# Patient Record
Sex: Female | Born: 1985 | Race: Black or African American | Hispanic: No | Marital: Married | Smoking: Never smoker
Health system: Southern US, Community
[De-identification: ages and names within clinical notes are randomized; demographics above are authoritative.]

## PROBLEM LIST (undated history)

## (undated) ENCOUNTER — Inpatient Hospital Stay (HOSPITAL_COMMUNITY): Payer: Self-pay

## (undated) DIAGNOSIS — B009 Herpesviral infection, unspecified: Secondary | ICD-10-CM

## (undated) DIAGNOSIS — I10 Essential (primary) hypertension: Secondary | ICD-10-CM

## (undated) DIAGNOSIS — B999 Unspecified infectious disease: Secondary | ICD-10-CM

## (undated) DIAGNOSIS — D649 Anemia, unspecified: Secondary | ICD-10-CM

## (undated) DIAGNOSIS — O135 Gestational [pregnancy-induced] hypertension without significant proteinuria, complicating the puerperium: Secondary | ICD-10-CM

## (undated) DIAGNOSIS — K219 Gastro-esophageal reflux disease without esophagitis: Secondary | ICD-10-CM

## (undated) DIAGNOSIS — D696 Thrombocytopenia, unspecified: Secondary | ICD-10-CM

## (undated) DIAGNOSIS — Z9289 Personal history of other medical treatment: Secondary | ICD-10-CM

## (undated) DIAGNOSIS — O99119 Other diseases of the blood and blood-forming organs and certain disorders involving the immune mechanism complicating pregnancy, unspecified trimester: Secondary | ICD-10-CM

## (undated) HISTORY — PX: TONSILLECTOMY: SUR1361

## (undated) HISTORY — PX: CHOLECYSTECTOMY: SHX55

---

## 2015-09-10 LAB — OB RESULTS CONSOLE RUBELLA ANTIBODY, IGM: Rubella: IMMUNE

## 2015-09-10 LAB — OB RESULTS CONSOLE GC/CHLAMYDIA
Chlamydia: NEGATIVE
Gonorrhea: NEGATIVE

## 2015-09-10 LAB — OB RESULTS CONSOLE ABO/RH: RH TYPE: POSITIVE

## 2015-09-10 LAB — OB RESULTS CONSOLE HEPATITIS B SURFACE ANTIGEN: HEP B S AG: NEGATIVE

## 2015-09-10 LAB — OB RESULTS CONSOLE ANTIBODY SCREEN: ANTIBODY SCREEN: NEGATIVE

## 2015-09-10 LAB — OB RESULTS CONSOLE RPR: RPR: NONREACTIVE

## 2015-09-10 LAB — OB RESULTS CONSOLE HIV ANTIBODY (ROUTINE TESTING): HIV: NONREACTIVE

## 2016-03-15 ENCOUNTER — Inpatient Hospital Stay (HOSPITAL_COMMUNITY): Payer: BLUE CROSS/BLUE SHIELD

## 2016-03-15 ENCOUNTER — Inpatient Hospital Stay (HOSPITAL_COMMUNITY)
Admission: AD | Admit: 2016-03-15 | Discharge: 2016-03-15 | Disposition: A | Discharge status: Home / Self Care | Payer: BLUE CROSS/BLUE SHIELD | Source: Ambulatory Visit | Attending: Obstetrics and Gynecology | Admitting: Obstetrics and Gynecology

## 2016-03-15 ENCOUNTER — Encounter (HOSPITAL_COMMUNITY): Payer: Self-pay | Admitting: *Deleted

## 2016-03-15 DIAGNOSIS — Z3A35 35 weeks gestation of pregnancy: Secondary | ICD-10-CM | POA: Insufficient documentation

## 2016-03-15 DIAGNOSIS — O99113 Other diseases of the blood and blood-forming organs and certain disorders involving the immune mechanism complicating pregnancy, third trimester: Secondary | ICD-10-CM | POA: Diagnosis not present

## 2016-03-15 DIAGNOSIS — O368130 Decreased fetal movements, third trimester, not applicable or unspecified: Secondary | ICD-10-CM | POA: Diagnosis present

## 2016-03-15 DIAGNOSIS — O368190 Decreased fetal movements, unspecified trimester, not applicable or unspecified: Secondary | ICD-10-CM | POA: Diagnosis not present

## 2016-03-15 DIAGNOSIS — Z3689 Encounter for other specified antenatal screening: Secondary | ICD-10-CM

## 2016-03-15 DIAGNOSIS — D696 Thrombocytopenia, unspecified: Secondary | ICD-10-CM | POA: Diagnosis not present

## 2016-03-15 DIAGNOSIS — Z3A38 38 weeks gestation of pregnancy: Secondary | ICD-10-CM

## 2016-03-15 HISTORY — DX: Thrombocytopenia, unspecified: O99.119

## 2016-03-15 HISTORY — DX: Gastro-esophageal reflux disease without esophagitis: K21.9

## 2016-03-15 HISTORY — DX: Herpesviral infection, unspecified: B00.9

## 2016-03-15 HISTORY — DX: Thrombocytopenia, unspecified: D69.6

## 2016-03-15 HISTORY — DX: Essential (primary) hypertension: I10

## 2016-03-15 LAB — URINE MICROSCOPIC-ADD ON: RBC / HPF: NONE SEEN RBC/hpf (ref 0–5)

## 2016-03-15 LAB — URINALYSIS, ROUTINE W REFLEX MICROSCOPIC
BILIRUBIN URINE: NEGATIVE
GLUCOSE, UA: NEGATIVE mg/dL
HGB URINE DIPSTICK: NEGATIVE
Ketones, ur: NEGATIVE mg/dL
Nitrite: NEGATIVE
Protein, ur: NEGATIVE mg/dL
SPECIFIC GRAVITY, URINE: 1.01 (ref 1.005–1.030)
pH: 7 (ref 5.0–8.0)

## 2016-03-15 LAB — OB RESULTS CONSOLE GBS: STREP GROUP B AG: POSITIVE

## 2016-03-15 IMAGING — US US MFM FETAL BPP W/O NON-STRESS
1 series · 12 of 27 positions shown · non-contrast
Comparison: none

[Series 1: us mfm fetal bpp w/o non-stress · 27 acquisitions, 12 frames shown]
[im 2/27]
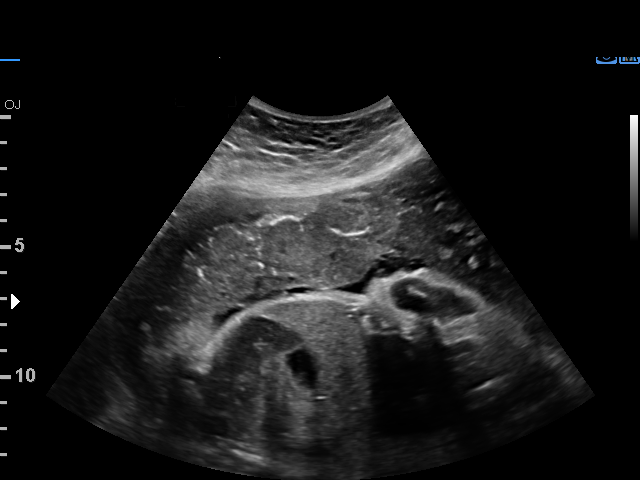
[im 4/27]
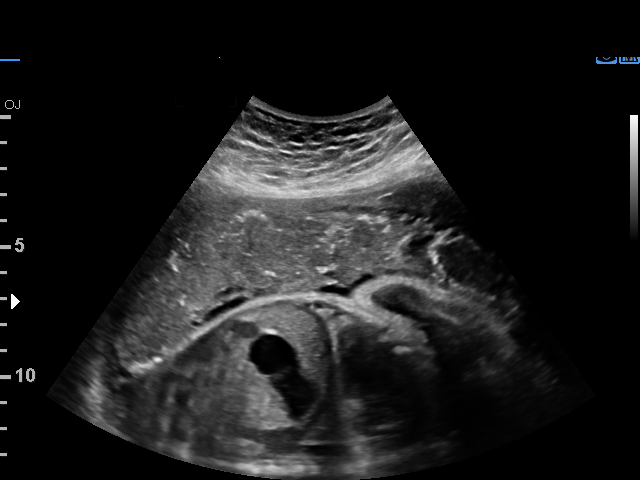
[im 6/27]
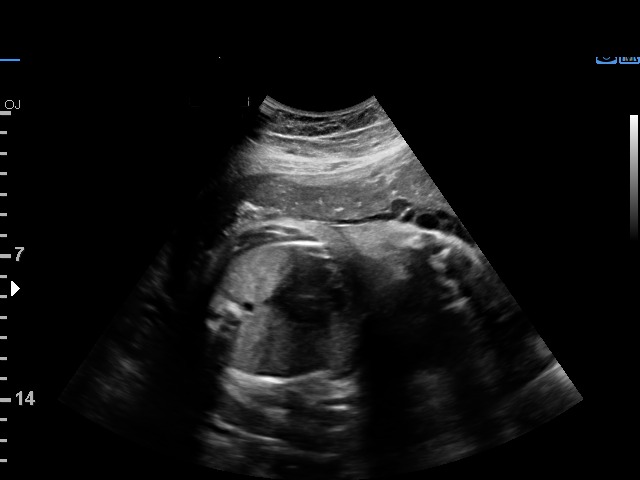
[im 8/27]
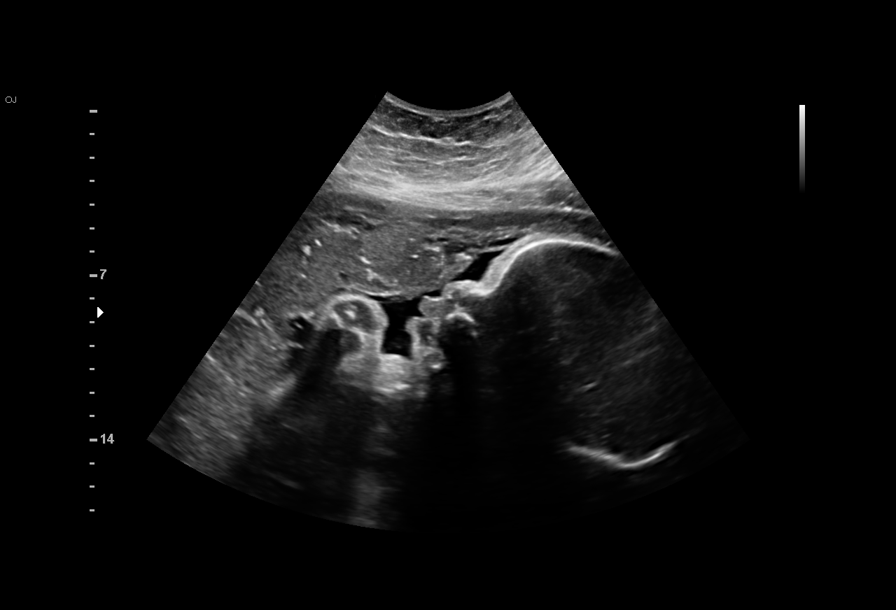
[im 11/27]
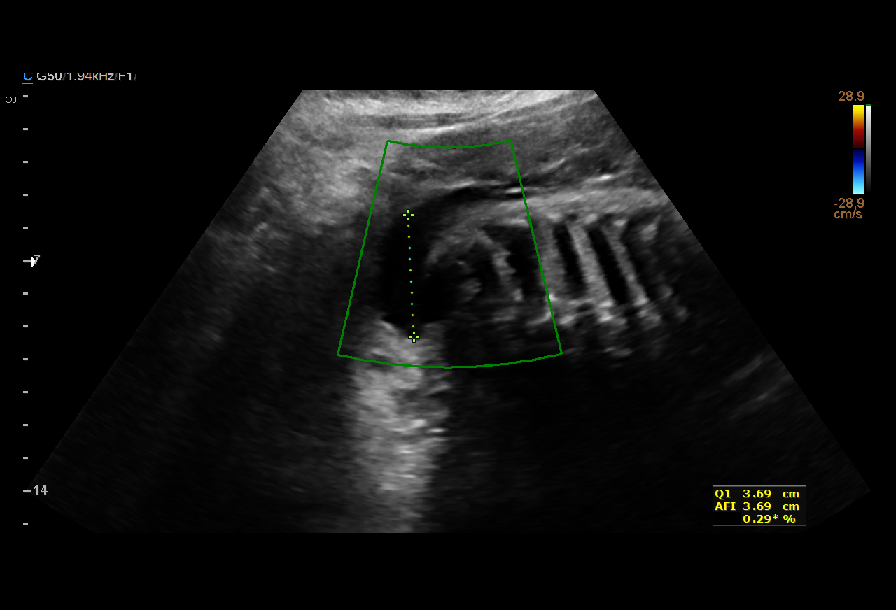
[im 13/27]
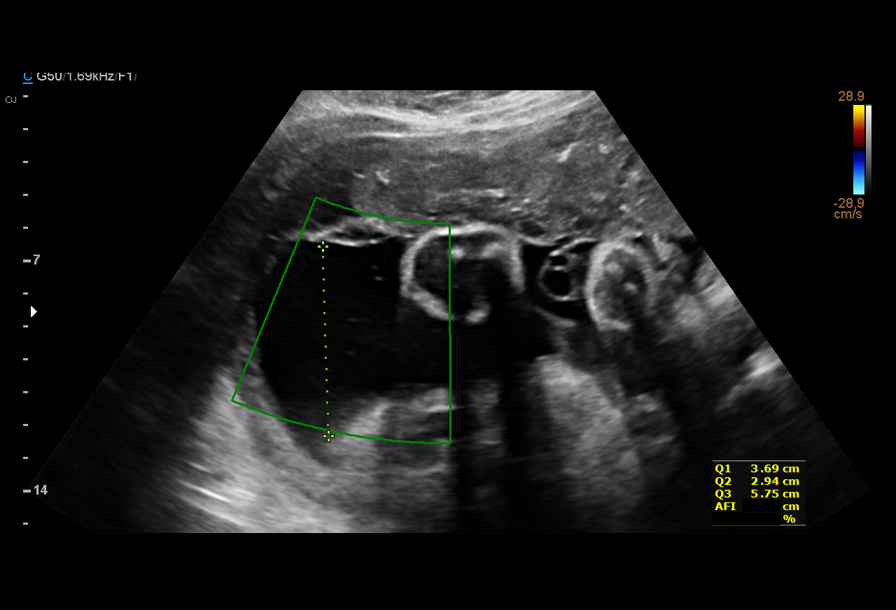
[im 15/27]
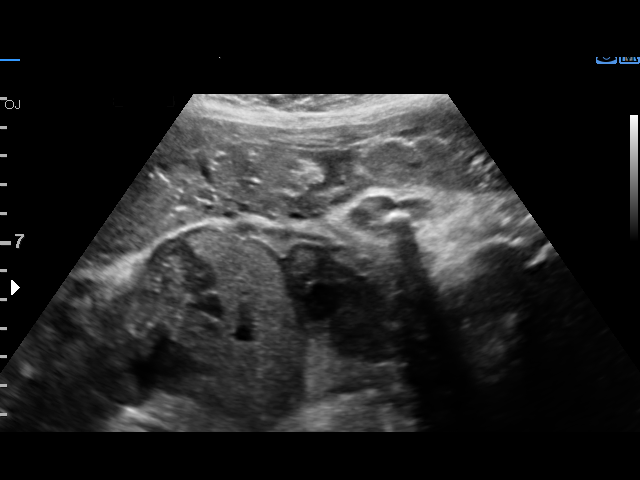
[im 17/27]
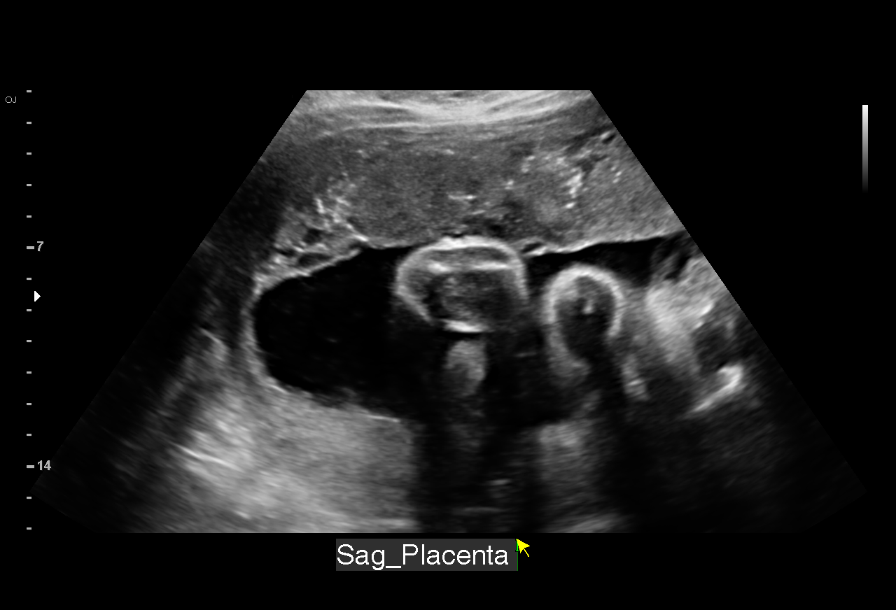
[im 20/27]
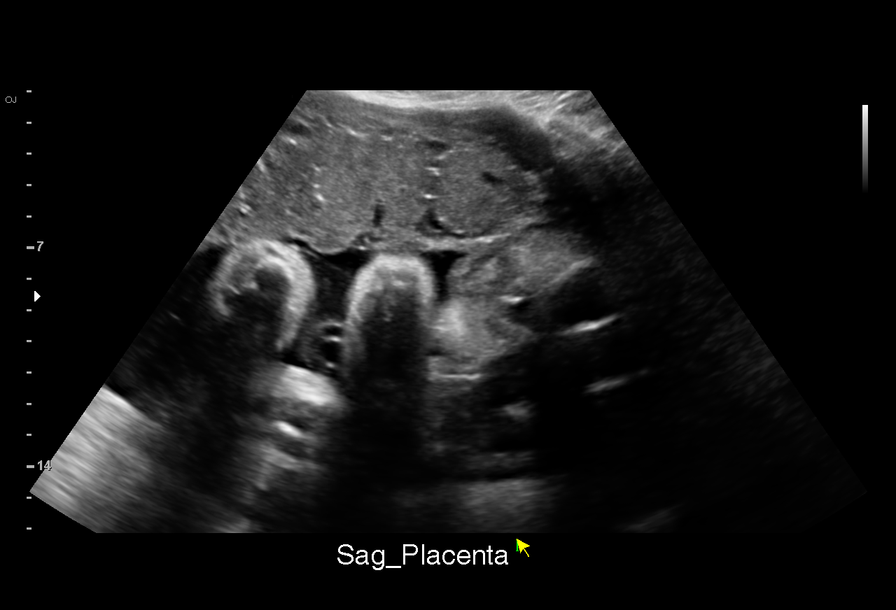
[im 22/27]
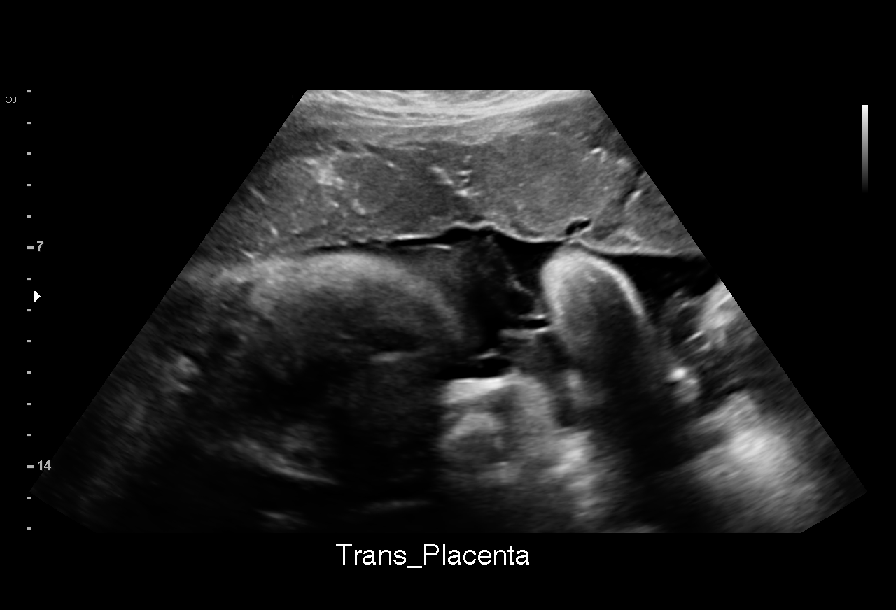
[im 24/27]
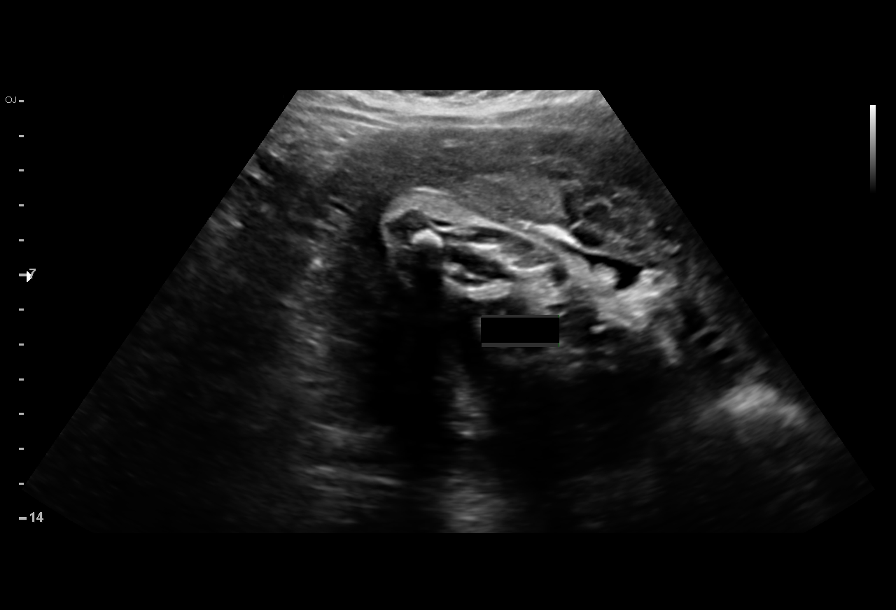
[im 26/27]
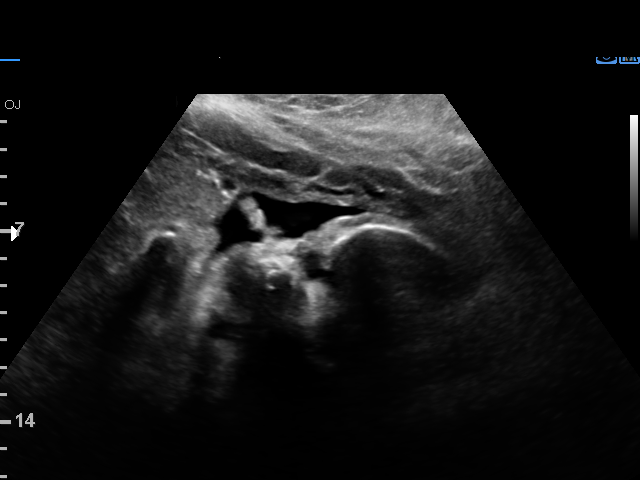

[12 of 27 positions shown; findings below may reference images not displayed]

Indications

35 weeks gestation of pregnancy
Decreased fetal movement                       [MK]
OB History

Gravidity:    1         Term:   0        Prem:   0        SAB:   0
TOP:          0       Ectopic:  0        Living: 0
Fetal Evaluation

Num Of Fetuses:     1
Fetal Heart         144
Rate(bpm):
Cardiac Activity:   Observed
Presentation:       Cephalic
Placenta:           Anterior, above cervical os

Amniotic Fluid
AFI FV:      Subjectively within normal limits

AFI Sum(cm)     %Tile       Largest Pocket(cm)
14.61           53

RUQ(cm)       RLQ(cm)       LUQ(cm)        LLQ(cm)
3.69
Biophysical Evaluation

Amniotic F.V:   Within normal limits       F. Tone:        Observed
F. Movement:    Observed                   Score:          [DATE]
F. Breathing:   Observed
Gestational Age

Clinical EDD:  35w 2d                                        EDD:   [DATE]
Best:          35w 2d     Det. By:  Clinical EDD             EDD:   [DATE]
Impression

SIUP at 35+2
Cephalic presentation
Normal amniotic fluid volume
BPP [DATE]
Recommendations

Follow-up as clinically indicated

## 2016-03-15 NOTE — MAU Provider Note (Signed)
History   [id number]   Chief Complaint  Patient presents with  . Decreased Fetal Movement    HPI Anna Bowman is a 30 y.o. female  G1P0 here with report of decreased fetal movement since last night. Pt seen in office today by Dr. Juliene Pina & sent here for monitoring & BPP. Pt reports no fetal movement since 8 pm last night. Reports feeling 3 contractions today. Denies vaginal bleeding or LOF. Cervix closed in the office. NST reactive in the office today.   No LMP recorded. Patient is pregnant.  OB History  Gravida Para Term Preterm AB Living  1            SAB TAB Ectopic Multiple Live Births               # Outcome Date GA Lbr Len/2nd Weight Sex Delivery Anes PTL Lv  1 Current               Past Medical History:  Diagnosis Date  . GERD (gastroesophageal reflux disease)   . HSV infection   . Hypertension   . Thrombocytopenia affecting pregnancy (HCC)     Family History  Problem Relation Age of Onset  . Hypertension Mother   . Hypertension Father     Social History   Social History  . Marital status: Married    Spouse name: N/A  . Number of children: N/A  . Years of education: N/A   Social History Main Topics  . Smoking status: Never Smoker  . Smokeless tobacco: Never Used  . Alcohol use No  . Drug use: No  . Sexual activity: Not Asked   Other Topics Concern  . None   Social History Narrative  . None    Allergies  Allergen Reactions  . Pineapple     Tongue and lips swell    No current facility-administered medications on file prior to encounter.    No current outpatient prescriptions on file prior to encounter.     Review of Systems  Constitutional: Negative.   Gastrointestinal: Negative.   Genitourinary: Negative.      Physical Exam   Vitals:   03/15/16 1248 03/15/16 1436  BP: 136/75 136/82  Pulse: 67 65  Resp: 16 18  Temp: 98.4 F (36.9 C)   TempSrc: Oral     Physical Exam  Nursing note and vitals reviewed. Constitutional: She  is oriented to person, place, and time. She appears well-developed and well-nourished. No distress.  HENT:  Head: Normocephalic and atraumatic.  Eyes: Conjunctivae are normal. Right eye exhibits no discharge. Left eye exhibits no discharge. No scleral icterus.  Neck: Normal range of motion.  Respiratory: Effort normal. No respiratory distress.  Neurological: She is alert and oriented to person, place, and time.  Skin: Skin is warm and dry. She is not diaphoretic.  Psychiatric: She has a normal mood and affect. Her behavior is normal. Judgment and thought content normal.   Fetal Tracing:  Baseline: 135 Variability: moderate Accelerations: 15x15 Decelerations: none  Toco: x1   MAU Course  Procedures  MDM Reactive fetal tracing Pt reports fetal movement since being in MAU -- 3 times BPP 8/8 with normal AFI S/w Dr. Billy Coast -- ok to discharge home, no further monitoring needed since pt feeling movement, NST reactive, & BPP 8/8  Assessment and Plan  A; 1. Decreased fetal movement   2. [redacted] weeks gestation of pregnancy   3. NST (non-stress test) reactive     P: Discharge home  Fetal kick counts Discussed reasons to return to MAU Keep f/u with OB   Judeth HornErin Tehran Rabenold, NP 03/15/2016 1:29 PM

## 2016-03-15 NOTE — MAU Note (Signed)
Sent from dr's office,  Decreased fm since yesterday, though reactive.  Has been feeling increased pressure and some tightness in abd, cx is closed

## 2016-03-15 NOTE — Discharge Instructions (Signed)
Fetal Movement Counts  Patient Name: __________________________________________________ Patient Due Date: ____________________  Performing a fetal movement count is highly recommended in high-risk pregnancies, but it is good for every pregnant woman to do. Your health care provider may ask you to start counting fetal movements at 28 weeks of the pregnancy. Fetal movements often increase:  · After eating a full meal.  · After physical activity.  · After eating or drinking something sweet or cold.  · At rest.  Pay attention to when you feel the baby is most active. This will help you notice a pattern of your baby's sleep and wake cycles and what factors contribute to an increase in fetal movement. It is important to perform a fetal movement count at the same time each day when your baby is normally most active.   HOW TO COUNT FETAL MOVEMENTS  1. Find a quiet and comfortable area to sit or lie down on your left side. Lying on your left side provides the best blood and oxygen circulation to your baby.  2. Write down the day and time on a sheet of paper or in a journal.  3. Start counting kicks, flutters, swishes, rolls, or jabs in a 2-hour period. You should feel at least 10 movements within 2 hours.  4. If you do not feel 10 movements in 2 hours, wait 2-3 hours and count again. Look for a change in the pattern or not enough counts in 2 hours.  SEEK MEDICAL CARE IF:  · You feel less than 10 counts in 2 hours, tried twice.  · There is no movement in over an hour.  · The pattern is changing or taking longer each day to reach 10 counts in 2 hours.  · You feel the baby is not moving as he or she usually does.  Date: ____________ Movements: ____________ Start time: ____________ Finish time: ____________   Date: ____________ Movements: ____________ Start time: ____________ Finish time: ____________  Date: ____________ Movements: ____________ Start time: ____________ Finish time: ____________  Date: ____________ Movements:  ____________ Start time: ____________ Finish time: ____________  Date: ____________ Movements: ____________ Start time: ____________ Finish time: ____________  Date: ____________ Movements: ____________ Start time: ____________ Finish time: ____________  Date: ____________ Movements: ____________ Start time: ____________ Finish time: ____________  Date: ____________ Movements: ____________ Start time: ____________ Finish time: ____________   Date: ____________ Movements: ____________ Start time: ____________ Finish time: ____________  Date: ____________ Movements: ____________ Start time: ____________ Finish time: ____________  Date: ____________ Movements: ____________ Start time: ____________ Finish time: ____________  Date: ____________ Movements: ____________ Start time: ____________ Finish time: ____________  Date: ____________ Movements: ____________ Start time: ____________ Finish time: ____________  Date: ____________ Movements: ____________ Start time: ____________ Finish time: ____________  Date: ____________ Movements: ____________ Start time: ____________ Finish time: ____________   Date: ____________ Movements: ____________ Start time: ____________ Finish time: ____________  Date: ____________ Movements: ____________ Start time: ____________ Finish time: ____________  Date: ____________ Movements: ____________ Start time: ____________ Finish time: ____________  Date: ____________ Movements: ____________ Start time: ____________ Finish time: ____________  Date: ____________ Movements: ____________ Start time: ____________ Finish time: ____________  Date: ____________ Movements: ____________ Start time: ____________ Finish time: ____________  Date: ____________ Movements: ____________ Start time: ____________ Finish time: ____________   Date: ____________ Movements: ____________ Start time: ____________ Finish time: ____________  Date: ____________ Movements: ____________ Start time: ____________ Finish  time: ____________  Date: ____________ Movements: ____________ Start time: ____________ Finish time: ____________  Date: ____________ Movements: ____________ Start time:   ____________ Finish time: ____________  Date: ____________ Movements: ____________ Start time: ____________ Finish time: ____________  Date: ____________ Movements: ____________ Start time: ____________ Finish time: ____________  Date: ____________ Movements: ____________ Start time: ____________ Finish time: ____________   Date: ____________ Movements: ____________ Start time: ____________ Finish time: ____________  Date: ____________ Movements: ____________ Start time: ____________ Finish time: ____________  Date: ____________ Movements: ____________ Start time: ____________ Finish time: ____________  Date: ____________ Movements: ____________ Start time: ____________ Finish time: ____________  Date: ____________ Movements: ____________ Start time: ____________ Finish time: ____________  Date: ____________ Movements: ____________ Start time: ____________ Finish time: ____________  Date: ____________ Movements: ____________ Start time: ____________ Finish time: ____________   Date: ____________ Movements: ____________ Start time: ____________ Finish time: ____________  Date: ____________ Movements: ____________ Start time: ____________ Finish time: ____________  Date: ____________ Movements: ____________ Start time: ____________ Finish time: ____________  Date: ____________ Movements: ____________ Start time: ____________ Finish time: ____________  Date: ____________ Movements: ____________ Start time: ____________ Finish time: ____________  Date: ____________ Movements: ____________ Start time: ____________ Finish time: ____________  Date: ____________ Movements: ____________ Start time: ____________ Finish time: ____________   Date: ____________ Movements: ____________ Start time: ____________ Finish time: ____________  Date: ____________  Movements: ____________ Start time: ____________ Finish time: ____________  Date: ____________ Movements: ____________ Start time: ____________ Finish time: ____________  Date: ____________ Movements: ____________ Start time: ____________ Finish time: ____________  Date: ____________ Movements: ____________ Start time: ____________ Finish time: ____________  Date: ____________ Movements: ____________ Start time: ____________ Finish time: ____________  Date: ____________ Movements: ____________ Start time: ____________ Finish time: ____________   Date: ____________ Movements: ____________ Start time: ____________ Finish time: ____________  Date: ____________ Movements: ____________ Start time: ____________ Finish time: ____________  Date: ____________ Movements: ____________ Start time: ____________ Finish time: ____________  Date: ____________ Movements: ____________ Start time: ____________ Finish time: ____________  Date: ____________ Movements: ____________ Start time: ____________ Finish time: ____________  Date: ____________ Movements: ____________ Start time: ____________ Finish time: ____________     This information is not intended to replace advice given to you by your health care provider. Make sure you discuss any questions you have with your health care provider.     Document Released: 07/05/2006 Document Revised: 06/26/2014 Document Reviewed: 04/01/2012  Elsevier Interactive Patient Education ©2016 [employer]

## 2016-03-19 DIAGNOSIS — Z9289 Personal history of other medical treatment: Secondary | ICD-10-CM

## 2016-03-19 HISTORY — DX: Personal history of other medical treatment: Z92.89

## 2016-03-29 ENCOUNTER — Other Ambulatory Visit: Payer: Self-pay | Admitting: Obstetrics

## 2016-04-03 ENCOUNTER — Telehealth (HOSPITAL_COMMUNITY): Payer: Self-pay | Admitting: *Deleted

## 2016-04-03 ENCOUNTER — Encounter (HOSPITAL_COMMUNITY): Payer: Self-pay | Admitting: *Deleted

## 2016-04-03 NOTE — Telephone Encounter (Signed)
Preadmission screen  

## 2016-04-05 ENCOUNTER — Encounter (HOSPITAL_COMMUNITY)
Admission: AD | Disposition: A | Discharge status: Home / Self Care | Payer: Self-pay | Source: Ambulatory Visit | Attending: Obstetrics

## 2016-04-05 ENCOUNTER — Inpatient Hospital Stay (HOSPITAL_COMMUNITY): Payer: BLUE CROSS/BLUE SHIELD | Admitting: Anesthesiology

## 2016-04-05 ENCOUNTER — Encounter (HOSPITAL_COMMUNITY): Payer: Self-pay | Admitting: *Deleted

## 2016-04-05 ENCOUNTER — Inpatient Hospital Stay (HOSPITAL_COMMUNITY)
Admission: AD | Admit: 2016-04-05 | Discharge: 2016-04-09 | DRG: 765 | Disposition: A | Discharge status: Home / Self Care | Payer: BLUE CROSS/BLUE SHIELD | Source: Ambulatory Visit | Attending: Obstetrics | Admitting: Obstetrics

## 2016-04-05 DIAGNOSIS — K219 Gastro-esophageal reflux disease without esophagitis: Secondary | ICD-10-CM | POA: Diagnosis present

## 2016-04-05 DIAGNOSIS — Z823 Family history of stroke: Secondary | ICD-10-CM | POA: Diagnosis not present

## 2016-04-05 DIAGNOSIS — O1002 Pre-existing essential hypertension complicating childbirth: Secondary | ICD-10-CM | POA: Diagnosis present

## 2016-04-05 DIAGNOSIS — O99214 Obesity complicating childbirth: Secondary | ICD-10-CM | POA: Diagnosis present

## 2016-04-05 DIAGNOSIS — Z8249 Family history of ischemic heart disease and other diseases of the circulatory system: Secondary | ICD-10-CM

## 2016-04-05 DIAGNOSIS — Z3A38 38 weeks gestation of pregnancy: Secondary | ICD-10-CM | POA: Diagnosis not present

## 2016-04-05 DIAGNOSIS — O99824 Streptococcus B carrier state complicating childbirth: Secondary | ICD-10-CM | POA: Diagnosis present

## 2016-04-05 DIAGNOSIS — O9081 Anemia of the puerperium: Secondary | ICD-10-CM | POA: Diagnosis not present

## 2016-04-05 DIAGNOSIS — Z6839 Body mass index (BMI) 39.0-39.9, adult: Secondary | ICD-10-CM

## 2016-04-05 DIAGNOSIS — O9912 Other diseases of the blood and blood-forming organs and certain disorders involving the immune mechanism complicating childbirth: Secondary | ICD-10-CM | POA: Diagnosis present

## 2016-04-05 DIAGNOSIS — D696 Thrombocytopenia, unspecified: Secondary | ICD-10-CM | POA: Diagnosis present

## 2016-04-05 DIAGNOSIS — O365930 Maternal care for other known or suspected poor fetal growth, third trimester, not applicable or unspecified: Secondary | ICD-10-CM | POA: Diagnosis present

## 2016-04-05 DIAGNOSIS — O9962 Diseases of the digestive system complicating childbirth: Secondary | ICD-10-CM | POA: Diagnosis present

## 2016-04-05 DIAGNOSIS — O139 Gestational [pregnancy-induced] hypertension without significant proteinuria, unspecified trimester: Secondary | ICD-10-CM | POA: Insufficient documentation

## 2016-04-05 DIAGNOSIS — O10919 Unspecified pre-existing hypertension complicating pregnancy, unspecified trimester: Secondary | ICD-10-CM | POA: Diagnosis present

## 2016-04-05 DIAGNOSIS — D62 Acute posthemorrhagic anemia: Secondary | ICD-10-CM | POA: Diagnosis not present

## 2016-04-05 LAB — COMPREHENSIVE METABOLIC PANEL
ALBUMIN: 3.2 g/dL — AB (ref 3.5–5.0)
ALK PHOS: 180 U/L — AB (ref 38–126)
ALT: 27 U/L (ref 14–54)
AST: 33 U/L (ref 15–41)
Anion gap: 7 (ref 5–15)
BILIRUBIN TOTAL: 0.4 mg/dL (ref 0.3–1.2)
CALCIUM: 8.9 mg/dL (ref 8.9–10.3)
CO2: 23 mmol/L (ref 22–32)
CREATININE: 0.59 mg/dL (ref 0.44–1.00)
Chloride: 103 mmol/L (ref 101–111)
GFR calc Af Amer: >60 mL/min (ref >60)
GFR calc non Af Amer: >60 mL/min (ref >60)
GLUCOSE: 74 mg/dL (ref 65–99)
Potassium: 4.1 mmol/L (ref 3.5–5.1)
SODIUM: 133 mmol/L — AB (ref 135–145)
Total Protein: 6.4 g/dL — ABNORMAL LOW (ref 6.5–8.1)

## 2016-04-05 LAB — URIC ACID: Uric Acid, Serum: 4 mg/dL (ref 2.3–6.6)

## 2016-04-05 LAB — CBC
HEMATOCRIT: 33 % — AB (ref 36.0–46.0)
HEMOGLOBIN: 11.6 g/dL — AB (ref 12.0–15.0)
MCH: 30.9 pg (ref 26.0–34.0)
MCHC: 35.2 g/dL (ref 30.0–36.0)
MCV: 88 fL (ref 78.0–100.0)
Platelets: 175 10*3/uL (ref 150–400)
RBC: 3.75 MIL/uL — ABNORMAL LOW (ref 3.87–5.11)
RDW: 13.7 % (ref 11.5–15.5)
WBC: 9 10*3/uL (ref 4.0–10.5)

## 2016-04-05 LAB — PROTEIN / CREATININE RATIO, URINE
Creatinine, Urine: 28 mg/dL
Total Protein, Urine: <6 mg/dL

## 2016-04-05 LAB — ABO/RH: ABO/RH(D): O POS

## 2016-04-05 SURGERY — Surgical Case
Anesthesia: Spinal

## 2016-04-05 MED ORDER — TERBUTALINE SULFATE 1 MG/ML IJ SOLN: 0.2500 mg | Freq: Once | INTRAMUSCULAR | Status: DC | PRN

## 2016-04-05 MED ORDER — DEXAMETHASONE SODIUM PHOSPHATE 10 MG/ML IJ SOLN
INTRAMUSCULAR | Status: DC | PRN
  Administered 2016-04-05: 10 mg via INTRAVENOUS

## 2016-04-05 MED ORDER — IBUPROFEN 600 MG PO TABS: 600.0000 mg | ORAL_TABLET | Freq: Four times a day (QID) | ORAL | Status: DC | PRN

## 2016-04-05 MED ORDER — ONDANSETRON HCL 4 MG/2ML IJ SOLN: 4.0000 mg | Freq: Four times a day (QID) | INTRAMUSCULAR | Status: DC | PRN

## 2016-04-05 MED ORDER — HYDROMORPHONE HCL 1 MG/ML IJ SOLN: 0.2500 mg | INTRAMUSCULAR | Status: DC | PRN

## 2016-04-05 MED ORDER — ONDANSETRON HCL 4 MG/2ML IJ SOLN
INTRAMUSCULAR | Status: DC | PRN
  Administered 2016-04-05: 4 mg via INTRAVENOUS

## 2016-04-05 MED ORDER — NALBUPHINE HCL 10 MG/ML IJ SOLN: 5.0000 mg | Freq: Once | INTRAMUSCULAR | Status: DC | PRN

## 2016-04-05 MED ORDER — LACTATED RINGERS IV SOLN
INTRAVENOUS | Status: DC
  Administered 2016-04-06: 07:00:00 via INTRAVENOUS

## 2016-04-05 MED ORDER — PHENYLEPHRINE 8 MG IN D5W 100 ML (0.08MG/ML) PREMIX OPTIME
INJECTION | INTRAVENOUS | Status: DC | PRN
  Administered 2016-04-05: 20 ug/min via INTRAVENOUS

## 2016-04-05 MED ORDER — SENNOSIDES-DOCUSATE SODIUM 8.6-50 MG PO TABS
2.0000 | ORAL_TABLET | ORAL | Status: DC
  Administered 2016-04-07 – 2016-04-08 (×2): 2 via ORAL
  Filled 2016-04-05 (×4): qty 2

## 2016-04-05 MED ORDER — DIPHENHYDRAMINE HCL 25 MG PO CAPS
25.0000 mg | ORAL_CAPSULE | ORAL | Status: DC | PRN
Start: 2016-04-05 — End: 2016-04-07

## 2016-04-05 MED ORDER — PRENATAL MULTIVITAMIN CH
1.0000 | ORAL_TABLET | Freq: Every day | ORAL | Status: DC
  Administered 2016-04-06 – 2016-04-09 (×4): 1 via ORAL
  Filled 2016-04-05 (×4): qty 1

## 2016-04-05 MED ORDER — SIMETHICONE 80 MG PO CHEW
80.0000 mg | CHEWABLE_TABLET | Freq: Three times a day (TID) | ORAL | Status: DC
  Administered 2016-04-06 – 2016-04-09 (×11): 80 mg via ORAL
  Filled 2016-04-05 (×12): qty 1

## 2016-04-05 MED ORDER — MISOPROSTOL 25 MCG QUARTER TABLET: 25.0000 ug | ORAL_TABLET | ORAL | Status: DC | PRN

## 2016-04-05 MED ORDER — KETOROLAC TROMETHAMINE 30 MG/ML IJ SOLN
30.0000 mg | Freq: Four times a day (QID) | INTRAMUSCULAR | Status: DC | PRN
  Administered 2016-04-05: 30 mg via INTRAMUSCULAR

## 2016-04-05 MED ORDER — PENICILLIN G POTASSIUM 5000000 UNITS IJ SOLR
2.5000 10*6.[IU] | INTRAVENOUS | Status: DC
  Filled 2016-04-05 (×4): qty 2.5

## 2016-04-05 MED ORDER — HYDRALAZINE HCL 20 MG/ML IJ SOLN: 10.0000 mg | Freq: Once | INTRAMUSCULAR | Status: DC | PRN

## 2016-04-05 MED ORDER — NALOXONE HCL 2 MG/2ML IJ SOSY: 1.0000 ug/kg/h | PREFILLED_SYRINGE | INTRAVENOUS | Status: DC | PRN

## 2016-04-05 MED ORDER — KETOROLAC TROMETHAMINE 30 MG/ML IJ SOLN: 30.0000 mg | Freq: Once | INTRAMUSCULAR | Status: DC

## 2016-04-05 MED ORDER — NALBUPHINE HCL 10 MG/ML IJ SOLN: 5.0000 mg | INTRAMUSCULAR | Status: DC | PRN

## 2016-04-05 MED ORDER — KETOROLAC TROMETHAMINE 30 MG/ML IJ SOLN
INTRAMUSCULAR | Status: AC
  Filled 2016-04-05: qty 1

## 2016-04-05 MED ORDER — ACETAMINOPHEN 325 MG PO TABS
650.0000 mg | ORAL_TABLET | ORAL | Status: DC | PRN
  Administered 2016-04-07 – 2016-04-08 (×4): 650 mg via ORAL
  Filled 2016-04-05 (×7): qty 2

## 2016-04-05 MED ORDER — OXYCODONE-ACETAMINOPHEN 5-325 MG PO TABS: 1.0000 | ORAL_TABLET | ORAL | Status: DC | PRN

## 2016-04-05 MED ORDER — MENTHOL 3 MG MT LOZG: 1.0000 | LOZENGE | OROMUCOSAL | Status: DC | PRN

## 2016-04-05 MED ORDER — MEPERIDINE HCL 25 MG/ML IJ SOLN: 6.2500 mg | INTRAMUSCULAR | Status: DC | PRN

## 2016-04-05 MED ORDER — LACTATED RINGERS IV SOLN
INTRAVENOUS | Status: DC
  Administered 2016-04-05 (×3): via INTRAVENOUS

## 2016-04-05 MED ORDER — LABETALOL HCL 5 MG/ML IV SOLN: 20.0000 mg | INTRAVENOUS | Status: DC | PRN

## 2016-04-05 MED ORDER — MISOPROSTOL 25 MCG QUARTER TABLET
25.0000 ug | ORAL_TABLET | ORAL | Status: DC | PRN
  Administered 2016-04-05: 25 ug via VAGINAL
  Filled 2016-04-05: qty 0.25

## 2016-04-05 MED ORDER — SOD CITRATE-CITRIC ACID 500-334 MG/5ML PO SOLN
30.0000 mL | ORAL | Status: DC | PRN
  Administered 2016-04-05: 30 mL via ORAL
  Filled 2016-04-05: qty 15

## 2016-04-05 MED ORDER — DIBUCAINE 1 % RE OINT: 1.0000 "application " | TOPICAL_OINTMENT | RECTAL | Status: DC | PRN

## 2016-04-05 MED ORDER — BUPIVACAINE IN DEXTROSE 0.75-8.25 % IT SOLN
INTRATHECAL | Status: DC | PRN
  Administered 2016-04-05: 10.5 mg via INTRATHECAL

## 2016-04-05 MED ORDER — WITCH HAZEL-GLYCERIN EX PADS: 1.0000 "application " | MEDICATED_PAD | CUTANEOUS | Status: DC | PRN

## 2016-04-05 MED ORDER — OXYTOCIN 10 UNIT/ML IJ SOLN
INTRAMUSCULAR | Status: AC
  Filled 2016-04-05: qty 4

## 2016-04-05 MED ORDER — FENTANYL CITRATE (PF) 100 MCG/2ML IJ SOLN
INTRAMUSCULAR | Status: AC
  Filled 2016-04-05: qty 2

## 2016-04-05 MED ORDER — OXYTOCIN 40 UNITS IN LACTATED RINGERS INFUSION - SIMPLE MED: 2.5000 [IU]/h | INTRAVENOUS | Status: DC

## 2016-04-05 MED ORDER — IBUPROFEN 600 MG PO TABS
600.0000 mg | ORAL_TABLET | Freq: Four times a day (QID) | ORAL | Status: DC
  Administered 2016-04-06 – 2016-04-08 (×8): 600 mg via ORAL
  Filled 2016-04-05 (×10): qty 1

## 2016-04-05 MED ORDER — MORPHINE SULFATE-NACL 0.5-0.9 MG/ML-% IV SOSY
PREFILLED_SYRINGE | INTRAVENOUS | Status: AC
  Filled 2016-04-05: qty 1

## 2016-04-05 MED ORDER — OXYTOCIN 40 UNITS IN LACTATED RINGERS INFUSION - SIMPLE MED: 1.0000 m[IU]/min | INTRAVENOUS | Status: DC

## 2016-04-05 MED ORDER — LIDOCAINE HCL (PF) 1 % IJ SOLN: 30.0000 mL | INTRAMUSCULAR | Status: DC | PRN

## 2016-04-05 MED ORDER — OXYCODONE-ACETAMINOPHEN 5-325 MG PO TABS: 2.0000 | ORAL_TABLET | ORAL | Status: DC | PRN

## 2016-04-05 MED ORDER — PENICILLIN G POTASSIUM 5000000 UNITS IJ SOLR
5.0000 10*6.[IU] | Freq: Once | INTRAVENOUS | Status: AC
  Administered 2016-04-05: 5 10*6.[IU] via INTRAVENOUS
  Filled 2016-04-05: qty 5

## 2016-04-05 MED ORDER — MORPHINE SULFATE (PF) 0.5 MG/ML IJ SOLN
INTRAMUSCULAR | Status: DC | PRN
  Administered 2016-04-05: .2 mg via EPIDURAL

## 2016-04-05 MED ORDER — ONDANSETRON HCL 4 MG/2ML IJ SOLN: 4.0000 mg | Freq: Three times a day (TID) | INTRAMUSCULAR | Status: DC | PRN

## 2016-04-05 MED ORDER — ACETAMINOPHEN 500 MG PO TABS
1000.0000 mg | ORAL_TABLET | Freq: Four times a day (QID) | ORAL | Status: AC
  Administered 2016-04-06 (×4): 1000 mg via ORAL
  Filled 2016-04-05 (×4): qty 2

## 2016-04-05 MED ORDER — OXYTOCIN 10 UNIT/ML IJ SOLN
INTRAMUSCULAR | Status: DC | PRN
  Administered 2016-04-05: 40 [IU] via INTRAVENOUS

## 2016-04-05 MED ORDER — COCONUT OIL OIL
1.0000 "application " | TOPICAL_OIL | Status: DC | PRN
  Administered 2016-04-07: 1 via TOPICAL
  Filled 2016-04-05: qty 120

## 2016-04-05 MED ORDER — ONDANSETRON HCL 4 MG/2ML IJ SOLN
INTRAMUSCULAR | Status: AC
  Filled 2016-04-05: qty 2

## 2016-04-05 MED ORDER — FENTANYL CITRATE (PF) 100 MCG/2ML IJ SOLN
INTRAMUSCULAR | Status: DC | PRN
  Administered 2016-04-05: 20 ug via INTRATHECAL

## 2016-04-05 MED ORDER — DIPHENHYDRAMINE HCL 25 MG PO CAPS: 25.0000 mg | ORAL_CAPSULE | Freq: Four times a day (QID) | ORAL | Status: DC | PRN

## 2016-04-05 MED ORDER — PHENYLEPHRINE 8 MG IN D5W 100 ML (0.08MG/ML) PREMIX OPTIME
INJECTION | INTRAVENOUS | Status: AC
  Filled 2016-04-05: qty 100

## 2016-04-05 MED ORDER — NALOXONE HCL 0.4 MG/ML IJ SOLN: 0.4000 mg | INTRAMUSCULAR | Status: DC | PRN

## 2016-04-05 MED ORDER — PROMETHAZINE HCL 25 MG/ML IJ SOLN: 6.2500 mg | INTRAMUSCULAR | Status: DC | PRN

## 2016-04-05 MED ORDER — SODIUM CHLORIDE 0.9% FLUSH: 3.0000 mL | INTRAVENOUS | Status: DC | PRN

## 2016-04-05 MED ORDER — KETOROLAC TROMETHAMINE 30 MG/ML IJ SOLN: 30.0000 mg | Freq: Four times a day (QID) | INTRAMUSCULAR | Status: DC | PRN

## 2016-04-05 MED ORDER — PENICILLIN G POTASSIUM 5000000 UNITS IJ SOLR
2.5000 10*6.[IU] | INTRAVENOUS | Status: DC
  Filled 2016-04-05 (×2): qty 2.5

## 2016-04-05 MED ORDER — ACETAMINOPHEN 325 MG PO TABS: 650.0000 mg | ORAL_TABLET | ORAL | Status: DC | PRN

## 2016-04-05 MED ORDER — CEFAZOLIN SODIUM-DEXTROSE 2-3 GM-% IV SOLR
INTRAVENOUS | Status: DC | PRN
  Administered 2016-04-05: 2 g via INTRAVENOUS

## 2016-04-05 MED ORDER — LACTATED RINGERS IV SOLN: 500.0000 mL | INTRAVENOUS | Status: DC | PRN

## 2016-04-05 MED ORDER — PHENYLEPHRINE 40 MCG/ML (10ML) SYRINGE FOR IV PUSH (FOR BLOOD PRESSURE SUPPORT): PREFILLED_SYRINGE | INTRAVENOUS | Status: DC | PRN

## 2016-04-05 MED ORDER — OXYTOCIN 40 UNITS IN LACTATED RINGERS INFUSION - SIMPLE MED: 2.5000 [IU]/h | INTRAVENOUS | Status: AC

## 2016-04-05 MED ORDER — DEXAMETHASONE SODIUM PHOSPHATE 10 MG/ML IJ SOLN
INTRAMUSCULAR | Status: AC
  Filled 2016-04-05: qty 1

## 2016-04-05 MED ORDER — TETANUS-DIPHTH-ACELL PERTUSSIS 5-2.5-18.5 LF-MCG/0.5 IM SUSP: 0.5000 mL | Freq: Once | INTRAMUSCULAR | Status: DC

## 2016-04-05 MED ORDER — SCOPOLAMINE 1 MG/3DAYS TD PT72
1.0000 | MEDICATED_PATCH | Freq: Once | TRANSDERMAL | Status: AC
  Administered 2016-04-05: 1.5 mg via TRANSDERMAL

## 2016-04-05 MED ORDER — DIPHENHYDRAMINE HCL 50 MG/ML IJ SOLN: 12.5000 mg | INTRAMUSCULAR | Status: DC | PRN

## 2016-04-05 MED ORDER — ZOLPIDEM TARTRATE 5 MG PO TABS: 5.0000 mg | ORAL_TABLET | Freq: Every evening | ORAL | Status: DC | PRN

## 2016-04-05 MED ORDER — CEFAZOLIN SODIUM-DEXTROSE 2-4 GM/100ML-% IV SOLN
INTRAVENOUS | Status: AC
  Filled 2016-04-05: qty 100

## 2016-04-05 MED ORDER — SIMETHICONE 80 MG PO CHEW: 80.0000 mg | CHEWABLE_TABLET | ORAL | Status: DC | PRN

## 2016-04-05 MED ORDER — SIMETHICONE 80 MG PO CHEW
80.0000 mg | CHEWABLE_TABLET | ORAL | Status: DC
  Administered 2016-04-06 – 2016-04-08 (×4): 80 mg via ORAL
  Filled 2016-04-05 (×4): qty 1

## 2016-04-05 MED ORDER — OXYTOCIN BOLUS FROM INFUSION: 500.0000 mL | Freq: Once | INTRAVENOUS | Status: DC

## 2016-04-05 MED ORDER — PANTOPRAZOLE SODIUM 40 MG PO TBEC
40.0000 mg | DELAYED_RELEASE_TABLET | Freq: Every day | ORAL | Status: DC
  Administered 2016-04-07 – 2016-04-09 (×3): 40 mg via ORAL
  Filled 2016-04-05 (×3): qty 1

## 2016-04-05 SURGICAL SUPPLY — 33 items
BENZOIN TINCTURE PRP APPL 2/3 (GAUZE/BANDAGES/DRESSINGS) ×2 IMPLANT
CHLORAPREP W/TINT 26ML (MISCELLANEOUS) ×2 IMPLANT
CLAMP CORD UMBIL (MISCELLANEOUS) IMPLANT
CLOSURE STERI STRIP 1/2 X4 (GAUZE/BANDAGES/DRESSINGS) ×2 IMPLANT
CLOTH BEACON ORANGE TIMEOUT ST (SAFETY) ×2 IMPLANT
CONTAINER PREFILL 10% NBF 15ML (MISCELLANEOUS) IMPLANT
DRSG OPSITE POSTOP 4X10 (GAUZE/BANDAGES/DRESSINGS) ×2 IMPLANT
ELECT REM PT RETURN 9FT ADLT (ELECTROSURGICAL) ×2
ELECTRODE REM PT RTRN 9FT ADLT (ELECTROSURGICAL) ×1 IMPLANT
EXTRACTOR VACUUM M CUP 4 TUBE (SUCTIONS) IMPLANT
GLOVE BIO SURGEON STRL SZ 6.5 (GLOVE) ×2 IMPLANT
GLOVE BIOGEL PI IND STRL 7.0 (GLOVE) ×2 IMPLANT
GLOVE BIOGEL PI INDICATOR 7.0 (GLOVE) ×2
GOWN STRL REUS W/TWL LRG LVL3 (GOWN DISPOSABLE) ×4 IMPLANT
KIT ABG SYR 3ML LUER SLIP (SYRINGE) IMPLANT
NEEDLE HYPO 22GX1.5 SAFETY (NEEDLE) IMPLANT
NEEDLE HYPO 25X5/8 SAFETYGLIDE (NEEDLE) IMPLANT
NS IRRIG 1000ML POUR BTL (IV SOLUTION) ×2 IMPLANT
PACK C SECTION WH (CUSTOM PROCEDURE TRAY) ×2 IMPLANT
PAD OB MATERNITY 4.3X12.25 (PERSONAL CARE ITEMS) ×2 IMPLANT
PENCIL SMOKE EVAC W/HOLSTER (ELECTROSURGICAL) ×2 IMPLANT
STRIP CLOSURE SKIN 1/2X4 (GAUZE/BANDAGES/DRESSINGS) IMPLANT
SUT MON AB 4-0 PS1 27 (SUTURE) ×2 IMPLANT
SUT PLAIN 0 NONE (SUTURE) IMPLANT
SUT PLAIN 2 0 XLH (SUTURE) ×2 IMPLANT
SUT VIC AB 0 CT1 36 (SUTURE) ×4 IMPLANT
SUT VIC AB 0 CTX 36 (SUTURE) ×3
SUT VIC AB 0 CTX36XBRD ANBCTRL (SUTURE) ×3 IMPLANT
SUT VIC AB 2-0 CT1 27 (SUTURE) ×1
SUT VIC AB 2-0 CT1 TAPERPNT 27 (SUTURE) ×1 IMPLANT
SYR CONTROL 10ML LL (SYRINGE) IMPLANT
TOWEL OR 17X24 6PK STRL BLUE (TOWEL DISPOSABLE) ×2 IMPLANT
TRAY FOLEY CATH SILVER 14FR (SET/KITS/TRAYS/PACK) IMPLANT

## 2016-04-05 NOTE — Op Note (Signed)
04/05/2016  6:56 PM  PATIENT:  Anna Bowman  30 y.o. female  PRE-OPERATIVE DIAGNOSIS:  Primary Cesarean for Non Reassuring fetal heart tones  POST-OPERATIVE DIAGNOSIS:  Primary Cesarean for Non Reassuring fetal heart tones  PROCEDURE:  Procedure(s): CESAREAN SECTION (N/A)  LTCS, 2 layer closure  SURGEON:  Surgeon(s) and Role:    * Noland Fordyce, MD - Primary  PHYSICIAN ASSISTANT:   ASSISTANTSFredric Mare, CNM   ANESTHESIA:   epidural  EBL:  Total I/O In: 1600 [I.V.:1600] Out: 1300 [Urine:500; Blood:800]  BLOOD ADMINISTERED:none  DRAINS: Urinary Catheter (Foley)   LOCAL MEDICATIONS USED:  NONE  SPECIMEN:  Source of Specimen:  placenta  DISPOSITION OF SPECIMEN:  L&D  COUNTS:  YES  TOURNIQUET:  * No tourniquets in log *  DICTATION: .Note written in EPIC  PLAN OF CARE: Admit to inpatient   PATIENT DISPOSITION:  PACU - hemodynamically stable.   Delay start of Pharmacological VTE agent (>24hrs) due to surgical blood loss or risk of bleeding: yes     Findings:  @BABYSEXEBC @ infant,  APGAR (1 MIN): 8   APGAR (5 MINS): 8   APGAR (10 MINS):   Normal uterus, tubes and ovaries, normal placenta. 3VC, clear amniotic fluid, nuchal cord x 1, thin umbilical cord lacking Whartons jelly, SGA baby  EBL: per anasthesia Antibiotics:   2g Ancef Complications: none  Indications: This is a 30 y.o. year-old, G1  At [redacted]w[redacted]d admitted for gest htn superimposed on chronic htn w/o evidence PEC. Pt had cytotec x 1 and insertion of cervical foley. About 1 hr later several prolonged variables, 2-8 min, followed by lack of variability and subtle late decels. Some improvement to decels with position change but over 1 hr still with no improvement in variability and no accels. Given pt remote from delivery decision made to remvoe cervical foley bulb and proceed with PCS. Risks benefits and alternatives of the procedure were discussed with the patient who agreed to proceed  Procedure:  After  informed consent was obtained the patient was taken to the operating room where epidural anesthesia was initiated.  She was prepped and draped in the normal sterile fashion in dorsal supine position with a leftward tilt.  A foley catheter was in place.  A Pfannenstiel skin incision was made 2 cm above the pubic symphysis in the midline with the scalpel.  Dissection was carried down with the Bovie cautery until the fascia was reached. The fascia was incised in the midline. The incision was extended laterally with the Mayo scissors. The inferior aspect of the fascial incision was grasped with the Coker clamps, elevated up and the underlying rectus muscles were dissected off sharply. The superior aspect of the fascial incision was grasped with the Coker clamps elevated up and the underlying rectus muscles were dissected off sharply.  The peritoneum was entered bluntly. The peritoneal incision was extended superiorly and inferiorly with good visualization of the bladder. The bladder blade was inserted and palpation was done to assess the fetal position and the location of the uterine vessels. The lower segment of the uterus was incised sharply with the scalpel and extended  bluntly in the cephalo-caudal fashion. The infant was grasped, brought to the incision,  rotated and the infant was delivered with fundal pressure after reducing the nuchal cord.  The cord was clamped and cut after 1 minute of delay.. The infant was handed off to the waiting pediatrician. The placenta was expressed. The uterus was exteriorized. The uterus was cleared of all  clots and debris. The uterine incision was repaired with 0 Vicryl in a running locked fashion.  A second layer of the same suture was used in an imbricating fashion to obtain excellent hemostasis. Several additional figure of 8 sutures placed in the midpoint of the incision. The uterus was then returned to the abdomen, the gutters were cleared of all clots and debris. The uterine  incision was reinspected and found to be hemostatic. The peritoneum was grasped and closed with 2-0 Vicryl in a running fashion. The cut muscle edges and the underside of the fascia were inspected and found to be hemostatic. The fascia was closed with 0 Vicryl in a single layer. The subcutaneous tissue was irrigated. Scarpa's layer was closed with a 2-0 plain gut suture. The skin was closed with a 4-0 Monocryl in a single layer. The patient tolerated the procedure well. Sponge lap and needle counts were correct x3 and patient was taken to the recovery room in a stable condition.  Stefanos Haynesworth A. 04/05/2016 6:57 PM

## 2016-04-05 NOTE — Brief Op Note (Signed)
04/05/2016  6:56 PM  PATIENT:  Anna Bowman  30 y.o. female  PRE-OPERATIVE DIAGNOSIS:  Primary Cesarean for Non Reassuring fetal heart tones  POST-OPERATIVE DIAGNOSIS:  Primary Cesarean for Non Reassuring fetal heart tones  PROCEDURE:  Procedure(s): CESAREAN SECTION (N/A)  LTCS, 2 layer closure  SURGEON:  Surgeon(s) and Role:    * Noland FordyceKelly Leisa Gault, MD - Primary  PHYSICIAN ASSISTANT:   ASSISTANTSFredric Mare: Bailey, CNM   ANESTHESIA:   epidural  EBL:  Total I/O In: 1600 [I.V.:1600] Out: 1300 [Urine:500; Blood:800]  BLOOD ADMINISTERED:none  DRAINS: Urinary Catheter (Foley)   LOCAL MEDICATIONS USED:  NONE  SPECIMEN:  Source of Specimen:  placenta  DISPOSITION OF SPECIMEN:  L&D  COUNTS:  YES  TOURNIQUET:  * No tourniquets in log *  DICTATION: .Note written in EPIC  PLAN OF CARE: Admit to inpatient   PATIENT DISPOSITION:  PACU - hemodynamically stable.   Delay start of Pharmacological VTE agent (>24hrs) due to surgical blood loss or risk of bleeding: yes

## 2016-04-05 NOTE — Progress Notes (Signed)
Cervical foley placement.  D/w pt again indication for delivery. Plan for cytotec and cervical foley reviewed with pt.  cvx check. FT/ long/ vtx  Sterile speculum placed into the vagina, cvx visualized, betadine x 3 to cervix, ring forceps used to grasp foley catheter and  Used to thread foley through cervix. Once past internal os, balloon inflated with 60 cc NS. Pt tolerated well, instruments removed, catheter taped to leg on tension.  Anna Bowman A. 04/05/2016 4:02 PM

## 2016-04-05 NOTE — Lactation Note (Signed)
This note was copied from a baby's chart. Lactation Consultation Note  Patient Name: Anna Bowman ZOXWR'UToday's Date: 04/05/2016 Reason for consult: Initial assessment Baby at 3 hr of life. RN requesting help with latch. Mom has large compressible breast with short shaft nipples. Baby was able to get a deep/comfortable latch with consistent sucking. Discussed LPT infant behavior (because of baby's size and blood sugar), feeding frequency, baby belly size, voids, wt loss, breast changes, and nipple care. RN to review LPT infant sheet and set up DEBP. Demonstrated manual expression, colostrum noted bilaterally, spoon in room. Mom will offer the breast on demand 8+/24hr, manually express/spoon feed after bf, and post pump. Given lactation handouts. Aware of OP services and support group.     Maternal Data Has patient been taught Hand Expression?: Yes Does the patient have breastfeeding experience prior to this delivery?: No  Feeding Feeding Type: Breast Fed Length of feed: 20 min  LATCH Score/Interventions Latch: Repeated attempts needed to sustain latch, nipple held in mouth throughout feeding, stimulation needed to elicit sucking reflex. Intervention(s): Adjust position;Assist with latch  Audible Swallowing: A few with stimulation Intervention(s): Skin to skin;Hand expression Intervention(s): Alternate breast massage  Type of Nipple: Everted at rest and after stimulation Intervention(s): Reverse pressure  Comfort (Breast/Nipple): Soft / non-tender     Hold (Positioning): Full assist, staff holds infant at breast Intervention(s): Support Pillows;Position options  LATCH Score: 6  Lactation Tools Discussed/Used WIC Program: No   Consult Status Consult Status: Follow-up Date: 04/06/16 Follow-up type: In-patient    Anna Bowman 04/05/2016, 10:04 PM

## 2016-04-05 NOTE — H&P (Signed)
Anna Bowman is a 30 y.o. G1P0 at [redacted]w[redacted]d presenting for IOL due to gestational hypertension superimposed on chronic htn. Pt notes no contractions. Good fetal movement, No vaginal bleeding, not leaking fluid. Pt notes HA yesterday and again today. Given her HA she decided to check bp and noted new elevations since yesterday. bp elevated at office and decision for IOL. Pt notes increase in edema and signif wt gain noted since last wk. No scotomata, no RUQ pain.   [facility] at [facility] since 7 wks - Dated by LMP c/w 7 wk u/s - unplanned pregnancy - chronic hypertension, on labetalol prior to pregnancy, increased to tid at 24 wks. Serial labs, u/s and fetal testing reactive. New elevations a/w edema starting yesterday.bps >140s.  - Anemia, on iron - Thrombocytopenia - GERD, on Protonix - GBS pos   Prenatal Transfer Tool  Maternal Diabetes: No Genetic Screening: Normal Maternal Ultrasounds/Referrals: Normal Fetal Ultrasounds or other Referrals:  None Maternal Substance Abuse:  No Significant Maternal Medications:  None Significant Maternal Lab Results: None     OB History    Gravida Para Term Preterm AB Living   1             SAB TAB Ectopic Multiple Live Births                 Past Medical History:  Diagnosis Date  . GERD (gastroesophageal reflux disease)   . HSV infection   . Hypertension   . Thrombocytopenia affecting pregnancy [facility])    Past Surgical History:  Procedure Laterality Date  . TONSILLECTOMY     Family History: family history includes Cancer in her paternal aunt; Hypertension in her father and mother; Stroke in her maternal grandmother. Social History:  reports that she has never smoked. She has never used smokeless tobacco. She reports that she does not drink alcohol or use drugs.  Review of Systems - Negative except headache     Height 5\' 2"  (1.575 m), weight 97.1 kg (214 lb).  Physical Exam:  Gen: well appearing, no distress CV: RRR Pulm:  CTAB Back: no CVAT Abd: gravid, NT, no RUQ pain LE: trace edema, equal bilaterally, non-tender Toco: irregular FH: baseline 140s, accelerations present, no deceleratons, 10 beat variability  CBC    Component Value Date/Time   WBC 9.0 04/05/2016 1400   RBC 3.75 (L) 04/05/2016 1400   HGB 11.6 (L) 04/05/2016 1400   HCT 33.0 (L) 04/05/2016 1400   PLT 175 04/05/2016 1400   MCV 88.0 04/05/2016 1400   MCH 30.9 04/05/2016 1400   MCHC 35.2 04/05/2016 1400   RDW 13.7 04/05/2016 1400    CMP     Component Value Date/Time   NA 133 (L) 04/05/2016 1400   K 4.1 04/05/2016 1400   CL 103 04/05/2016 1400   CO2 23 04/05/2016 1400   GLUCOSE 74 04/05/2016 1400   BUN <5 (L) 04/05/2016 1400   CREATININE 0.59 04/05/2016 1400   CALCIUM 8.9 04/05/2016 1400   PROT 6.4 (L) 04/05/2016 1400   ALBUMIN 3.2 (L) 04/05/2016 1400   AST 33 04/05/2016 1400   ALT 27 04/05/2016 1400   ALKPHOS 180 (H) 04/05/2016 1400   BILITOT 0.4 04/05/2016 1400   GFRNONAA >60 04/05/2016 1400   GFRAA >60 04/05/2016 1400     Prenatal labs: ABO, Rh: O/Positive/-- (03/24 0000) Antibody: Negative (03/24 0000) Rubella: !Error! immune RPR: Nonreactive (03/24 0000)  HBsAg: Negative (03/24 0000)  HIV: Non-reactive (03/24 0000)  GBS: Positive (09/27 0000)  1 hr Glucola 125  Genetic screening normal NT, nl AFp Anatomy US normal   Assessment/Plan: 30 y.o. G1P0 at 251w2d Chronic htn with superimposed gestational htn. Given >37 wks recc move to delivery. Plan IOL. Plan foley bulb then pitocin, AROM when able. - GBS pos, PCN - htn, watch bp, check labs.  - thrombocytopenia   Breshay Ilg A. 04/05/2016, 2:49 PM

## 2016-04-05 NOTE — Anesthesia Postprocedure Evaluation (Signed)
Anesthesia Post Note  Patient: Anna Bowman  Procedure(s) Performed: Procedure(s) (LRB): CESAREAN SECTION (N/A)  Patient location during evaluation: PACU Anesthesia Type: Spinal Level of consciousness: awake and alert and oriented Pain management: pain level controlled Vital Signs Assessment: post-procedure vital signs reviewed and stable Respiratory status: spontaneous breathing, nonlabored ventilation and respiratory function stable Cardiovascular status: blood pressure returned to baseline and stable Postop Assessment: no headache, no backache, spinal receding and patient able to bend at knees Anesthetic complications: no     Last Vitals:  Vitals:   04/05/16 2000 04/05/16 2015  BP: 135/74 128/86  Pulse: 61 71  Resp: (!) 21 (!) 32  Temp:      Last Pain:  Vitals:   04/05/16 2015  TempSrc:   PainSc: 3    Pain Goal: Patients Stated Pain Goal: 4 (04/05/16 2015)               Irie Fiorello A.

## 2016-04-05 NOTE — Anesthesia Preprocedure Evaluation (Signed)
Anesthesia Evaluation  Patient identified by MRN, date of birth, ID band Patient awake    Reviewed: Allergy & Precautions, H&P , NPO status , Patient's Chart, lab work & pertinent test results  Airway Mallampati: II  TM Distance: >3 FB Neck ROM: full    Dental no notable dental hx.    Pulmonary neg pulmonary ROS,    Pulmonary exam normal        Cardiovascular hypertension, Pt. on home beta blockers Normal cardiovascular exam     Neuro/Psych negative neurological ROS  negative psych ROS   GI/Hepatic Neg liver ROS, GERD  Medicated and Controlled,  Endo/Other  Morbid obesity  Renal/GU negative Renal ROS     Musculoskeletal   Abdominal (+) + obese,   Peds  Hematology negative hematology ROS (+)   Anesthesia Other Findings   Reproductive/Obstetrics (+) Pregnancy                             Anesthesia Physical Anesthesia Plan  ASA: III  Anesthesia Plan: Spinal   Post-op Pain Management:    Induction:   Airway Management Planned:   Additional Equipment:   Intra-op Plan:   Post-operative Plan:   Informed Consent: I have reviewed the patients History and Physical, chart, labs and discussed the procedure including the risks, benefits and alternatives for the proposed anesthesia with the patient or authorized representative who has indicated his/her understanding and acceptance.     Plan Discussed with: CRNA and Surgeon  Anesthesia Plan Comments:         Anesthesia Quick Evaluation

## 2016-04-05 NOTE — Transfer of Care (Signed)
Immediate Anesthesia Transfer of Care Note  Patient: Anna Bowman Avey  Procedure(s) Performed: Procedure(s): CESAREAN SECTION (N/A)  Patient Location: PACU  Anesthesia Type:Spinal  Level of Consciousness: awake, alert  and oriented  Airway & Oxygen Therapy: Patient Spontanous Breathing  Post-op Assessment: Report given to RN and Post -op Vital signs reviewed and stable  Post vital signs: Reviewed and stable  Last Vitals:  Vitals:   04/05/16 1553 04/05/16 1636  BP: (!) 143/83 131/76  Pulse: 69 76  Resp: 16 17  Temp:      Last Pain:  Vitals:   04/05/16 1639  TempSrc:   PainSc: 5       Patients Stated Pain Goal: 7 (04/05/16 1639)  Complications: No apparent anesthesia complications

## 2016-04-05 NOTE — Anesthesia Procedure Notes (Signed)
Spinal  Patient location during procedure: OR Start time: 04/05/2016 5:54 PM End time: 04/05/2016 5:57 PM Staffing Anesthesiologist: Leilani AbleHATCHETT, Carmichael Burdette Performed: anesthesiologist  Preanesthetic Checklist Completed: patient identified, surgical consent, pre-op evaluation, timeout performed, IV checked, risks and benefits discussed and monitors and equipment checked Spinal Block Patient position: sitting Prep: site prepped and draped and DuraPrep Patient monitoring: heart rate, cardiac monitor, continuous pulse ox and blood pressure Approach: midline Location: L3-4 Injection technique: single-shot Needle Needle type: Sprotte  Needle gauge: 24 G Needle length: 9 cm Assessment Sensory level: T4

## 2016-04-06 ENCOUNTER — Encounter (HOSPITAL_COMMUNITY): Payer: Self-pay | Admitting: Obstetrics

## 2016-04-06 DIAGNOSIS — D62 Acute posthemorrhagic anemia: Secondary | ICD-10-CM | POA: Diagnosis not present

## 2016-04-06 LAB — CBC
HEMATOCRIT: 20.9 % — AB (ref 36.0–46.0)
HEMATOCRIT: 18.2 % — AB (ref 36.0–46.0)
HEMOGLOBIN: 7.6 g/dL — AB (ref 12.0–15.0)
Hemoglobin: 6.6 g/dL — CL (ref 12.0–15.0)
MCH: 32.1 pg (ref 26.0–34.0)
MCH: 32 pg (ref 26.0–34.0)
MCHC: 36.4 g/dL — AB (ref 30.0–36.0)
MCHC: 36.3 g/dL — ABNORMAL HIGH (ref 30.0–36.0)
MCV: 88.2 fL (ref 78.0–100.0)
MCV: 88.3 fL (ref 78.0–100.0)
PLATELETS: 131 10*3/uL — AB (ref 150–400)
Platelets: 155 10*3/uL (ref 150–400)
RBC: 2.37 MIL/uL — ABNORMAL LOW (ref 3.87–5.11)
RBC: 2.06 MIL/uL — AB (ref 3.87–5.11)
RDW: 13.9 % (ref 11.5–15.5)
RDW: 14 % (ref 11.5–15.5)
WBC: 22.7 10*3/uL — ABNORMAL HIGH (ref 4.0–10.5)
WBC: 17.4 10*3/uL — AB (ref 4.0–10.5)

## 2016-04-06 LAB — RPR: RPR Ser Ql: NONREACTIVE

## 2016-04-06 LAB — PREPARE RBC (CROSSMATCH)

## 2016-04-06 LAB — GLUCOSE, CAPILLARY: Glucose-Capillary: 160 mg/dL — ABNORMAL HIGH (ref 65–99)

## 2016-04-06 MED ORDER — SODIUM CHLORIDE 0.9 % IV SOLN
Freq: Once | INTRAVENOUS | Status: AC
  Administered 2016-04-06: 15:00:00 via INTRAVENOUS

## 2016-04-06 MED ORDER — LACTATED RINGERS IV BOLUS (SEPSIS)
500.0000 mL | Freq: Once | INTRAVENOUS | Status: AC
  Administered 2016-04-06: 500 mL via INTRAVENOUS

## 2016-04-06 NOTE — Addendum Note (Signed)
Addendum  created 04/06/16 0840 by Earmon PhoenixValerie P Gregory Barrick, CRNA   Sign clinical note

## 2016-04-06 NOTE — Lactation Note (Signed)
This note was copied from a baby's chart. Lactation Consultation Note Mom trying to latch baby when entered rm. Has large breast and small short shaft nipple. Mom had baby swaddled and t shirt on. Opened tshirt for STS during BF. Assisted in latching. Baby latched well and suckling on breast.  (LC got a call for low blood sugar and had to leave consult.) encouraged STS, massage breast at intervals. Post pumping for stimulation.  Patient Name: Anna Bowman General ZOXWR'UToday's Date: 04/06/2016 Reason for consult: Follow-up assessment   Maternal Data    Feeding Feeding Type: Breast Fed Length of feed: 10 min  LATCH Score/Interventions Latch: Repeated attempts needed to sustain latch, nipple held in mouth throughout feeding, stimulation needed to elicit sucking reflex. Intervention(s): Adjust position;Assist with latch;Breast massage;Breast compression  Audible Swallowing: None Intervention(s): Skin to skin;Hand expression Intervention(s): Alternate breast massage  Type of Nipple: Everted at rest and after stimulation  Comfort (Breast/Nipple): Soft / non-tender     Hold (Positioning): Assistance needed to correctly position infant at breast and maintain latch. Intervention(s): Support Pillows;Position options;Skin to skin  LATCH Score: 6  Lactation Tools Discussed/Used     Consult Status Consult Status: Follow-up Date: 04/06/16 Follow-up type: In-patient    Charyl DancerCARVER, Kylyn Mcdade G 04/06/2016, 6:43 AM

## 2016-04-06 NOTE — Progress Notes (Signed)
   04/06/16 0345  Vital Signs  BP (!) 89/48  BP Location Left Arm  Patient Position (if appropriate) Lying  BP Method Automatic  Pulse Rate Source Dinamap  Dr Malen GauzeFoster at bedside. LR 500 mls bolus infusing. Pt awake, alert and con versive.

## 2016-04-06 NOTE — Progress Notes (Addendum)
   04/06/16 0340  Vital Signs  BP (!) 62/25  BP Location Left Arm  Patient Position (if appropriate) Lying  BP Method Automatic  Pulse Rate 67  Pulse Rate Source Dinamap  Resp 18  Oxygen Therapy  SpO2 100 %  O2 Device Room Air  Call to patient room, pt unresponsive. Sternal rub, call pt by name, 02 non NRM initiated, 02 sat 100%. Pt started responding with moans and groans. Pt complains of lightheadedness. Rapid response nurse, House Coverage, Anesthesia called. 500 bolus of LR started. Patient orientation improving, able to speak with family members at bedside. Notified Fredric MareBailey CNM that anesthesia, house coverage and rapid response nurse called. She requests to put in any orders necessary per anesthesia and to update her afterwards. She will come in if necessary but to call Ernestina PennaFogleman if her condition gets worse or if she needs to go to the OR. We will call with update.

## 2016-04-06 NOTE — Progress Notes (Signed)
POD#1 PCS nonreasurring fetal testing   S: Pt notes fatigue but happy with baby. No HA, no vision change, minimal lochia. Pt notes no fever, no nausea or emesis. +flatus, regular diet, hungry, no abdominal pain.   Pt had syncopal episode early this am. IV fluid given. Pt tried to ambulate again this am and pre-syncope with significant orthostasis.  O: Vitals:   04/06/16 0639 04/06/16 0800 04/06/16 1030 04/06/16 1220  BP: 109/63 116/62 (!) 114/55 (!) 111/54  Pulse: 79 70 67 72  Resp: 18 16 18 16   Temp:  98.2 F (36.8 C)  98.6 F (37 C)  TempSrc:  Oral  Oral  SpO2: 98% 100% 100% 100%  Weight:      Height:        ORTHOSTATICS POSITIVE  Gen: well appearing, cheerful CV: RRR Pulm: CTAB Abd: soft distension, no tympany, appropriately tender, fundus at umbilicus, appropriately tender GU: minimal lochia LE: trace edema, NT  CBC Latest Ref Rng & Units 04/06/2016 04/06/2016 04/05/2016  WBC 4.0 - 10.5 K/uL 17.4(H) 22.7(H) 9.0  Hemoglobin 12.0 - 15.0 g/dL 6.6(LL) 7.6(L) 11.6(L)  Hematocrit 36.0 - 46.0 % 18.2(L) 20.9(L) 33.0(L)  Platelets 150 - 400 K/uL 131(L) 155 175    A/P: POD#1 s/p PCS for non-reasurring fetal testing in the setting of IOL for chronic htn with superimposed gestational htn.  - chronic htn. Labetalol on hold, will continue to evaluate need based on bp's - symptomatic anemia. Pt with baseline anemia but symptomatic with orthostasis and syncope. Recc blood transfusion, 2 units. R/B blood d/w pt who agrees. Hemoglobin drop does not meet expectation based on EBL at c/s. No report of heavy lochia on PP floor. Unclear source of blood loss- perhaps hemoconcentration in labor, underestimation of EBL. Given active GI function and stable abdominal exam no current concern for active intraperitoneal bleeding but this remains in DDx.  Kennidi Yoshida A. 04/06/2016 1:43 PM

## 2016-04-06 NOTE — Progress Notes (Signed)
   04/06/16 0354  Vital Signs  BP 113/64  BP Location Left Arm  Patient Position (if appropriate) Lying  BP Method Automatic  Pulse Rate 69  Pulse Rate Source Dinamap  Resp 20  Oxygen Therapy  SpO2 100 %  O2 Device Room Air  Pt condition continues to improve. Pt reported a sensation of a brief pain to her right upper chest, which resolved immediately without any intervention. Denies nausea or lightheadedness. Vital signs taken, as seen above. Plan of care discussed with patient and family, patient was instructed to remain in bed for the night. We will continue to monitor.

## 2016-04-06 NOTE — Progress Notes (Signed)
Critical lab value reported to midwife. Waiting for orders.

## 2016-04-06 NOTE — Lactation Note (Addendum)
This note was copied from a baby's chart. Lactation Consultation Note  Baby 6 hours old. < 6 lbs.  Mother cannot sit up completely due to BP. Mother states she has difficulty latching on L breast.  Demonstrated how to hand express. Mother has large breasts w/ short shaft nipples.  R nipple slightly larger than L side. Drops expressed bilaterally and helped evert L nipple. Assisted w/ latching using teacup hold and taught FOB how to assist. Baby latched for 25 min on L side.  IBCLC observed sucks and swallows for approx 25 min. Attempted latching on R side but baby seemed satisfied.  Placed baby STS on mother's chest. DEBP had not been set up in room so brought in DEBP kit, cleaning basin, soap, spoons and towel. Demonstrated how to use DEBP.  Suggest mother post pump 4-6 times a day for 10-20 min if able. Give baby back volume pumped.  Reviewed spoon feeding. Discussed cleaning and milk storage. Encouraged mother to call if she needs further assistance. Mom encouraged to feed baby 8-12 times/24 hours and with feeding cues at least every 3 hours.     Patient Name: Girl Alaiyah Bollman PCHEK'B Date: 04/06/2016 Reason for consult: Follow-up assessment   Maternal Data    Feeding Feeding Type: Breast Fed Length of feed: 25 min  LATCH Score/Interventions Latch: Repeated attempts needed to sustain latch, nipple held in mouth throughout feeding, stimulation needed to elicit sucking reflex. Intervention(s): Adjust position;Assist with latch;Breast massage  Audible Swallowing: A few with stimulation Intervention(s): Hand expression Intervention(s): Alternate breast massage  Type of Nipple: Everted at rest and after stimulation Intervention(s): Double electric pump  Comfort (Breast/Nipple): Soft / non-tender     Hold (Positioning): Assistance needed to correctly position infant at breast and maintain latch.  LATCH Score: 7  Lactation Tools Discussed/Used Pump Review: Setup,  frequency, and cleaning;Milk Storage Initiated by:: Vivianne Master RN Date initiated:: 04/07/16   Consult Status Consult Status: Follow-up Date: 04/07/16 Follow-up type: In-patient    Vivianne Master [facility] 04/06/2016, 1:27 PM

## 2016-04-06 NOTE — Progress Notes (Signed)
Patient stated she felt great and was ready to stand up.  While performing orthostatics (standing position) she said she started to feel dizzy and hot so instructed patient to lay back down.  Patient alert and said she feel much better already in this position.  BP was 68/42 while in standing position.  Post orthostatic, 114/55. Will continue to monitor.  Instructed patient to not get out of bed.  1040am - Midwife notified- Per Ivonne Andrewaniella Paul give 500 LR bolus then retry orthostatics.

## 2016-04-06 NOTE — Progress Notes (Signed)
   04/06/16 2145  Orthostatic Lying   BP- Lying 131/66  Pulse- Lying 79  Orthostatic Sitting  BP- Sitting 147/81  Pulse- Sitting 100  Orthostatic Standing at 0 minutes  BP- Standing at 0 minutes 153/90 (pt states she is not lightheaded, no visual disturbances.)  Pulse- Standing at 0 minutes 106  Patient ambulated out of bed with minimal assistance. Tolerated well. Patient states she is not lightheaded, dizzy, nauseated, she is not having any visual disturbances. Pericare given to the patient while standing at the side of her bed. Walked to chair with minimal assistance, tolerated well. Patient instructed to call out when she wants to get back into bed, she verbalizes her understanding. Will return to her room in 30-45 minutes to assess blood pressure and heart rate again.

## 2016-04-06 NOTE — Anesthesia Postprocedure Evaluation (Signed)
Anesthesia Post Note  Patient: Brenton Grillsshley Grimme  Procedure(s) Performed: Procedure(s) (LRB): CESAREAN SECTION (N/A)  Patient location during evaluation: Mother Baby Anesthesia Type: Regional Level of consciousness: awake and alert Pain management: pain level controlled Vital Signs Assessment: post-procedure vital signs reviewed and stable Respiratory status: spontaneous breathing Cardiovascular status: stable Postop Assessment: no headache, no backache and spinal receding Anesthetic complications: no Comments: Noted that pt had transient drop in BP during early morning hours. VSS presently. IV infusing. States she feels good now. To get up later this morning with assistance.     Last Vitals:  Vitals:   04/06/16 0507 04/06/16 0639  BP: (!) 112/59 109/63  Pulse: 65 79  Resp: 18 18  Temp:      Last Pain:  Vitals:   04/06/16 0400  TempSrc:   PainSc: 3    Pain Goal: Patients Stated Pain Goal: 4 (04/06/16 0400)               Edison PaceWILKERSON,Breniyah Romm

## 2016-04-06 NOTE — Progress Notes (Signed)
   04/06/16 0347  Vital Signs  BP 105/60  BP Location Left Arm  Patient Position (if appropriate) Lying  BP Method Automatic  Pulse Rate 71  Pulse Rate Source Dinamap  Resp 20  Oxygen Therapy  SpO2 100 %  O2 Device Room Air  Pt remain awake, alert, oriented and conversing. We continue to monitor.

## 2016-04-06 NOTE — Progress Notes (Signed)
MD Charlotte CrumbFolgman at bedside- updated MD on patient status. transfusing orders in place.  Will continue to monitor.

## 2016-04-06 NOTE — Progress Notes (Addendum)
   04/06/16 0507  Vital Signs  BP (!) 112/59  BP Location Right Arm  Patient Position (if appropriate) Lying  BP Method Automatic  Pulse Rate 65  Pulse Rate Source Dinamap  Resp 18  Oxygen Therapy  SpO2 98 %  O2 Device Room Air  Patient is alert, oriented and talking with family members at the bedside. Reports no nausea, lightheadedness or pain. Patient instructed to call if she feels lightheaded, dizzy, nauseous, pain or any changes. Will continue to monitor. Placed call to [facility] for update, will attempt to call again if no return phone call.

## 2016-04-07 LAB — CBC
HCT: 23.5 % — ABNORMAL LOW (ref 36.0–46.0)
HEMOGLOBIN: 8.1 g/dL — AB (ref 12.0–15.0)
MCH: 29.8 pg (ref 26.0–34.0)
MCHC: 34.5 g/dL (ref 30.0–36.0)
MCV: 86.4 fL (ref 78.0–100.0)
PLATELETS: 135 10*3/uL — AB (ref 150–400)
RBC: 2.72 MIL/uL — AB (ref 3.87–5.11)
RDW: 14.7 % (ref 11.5–15.5)
WBC: 14.9 10*3/uL — AB (ref 4.0–10.5)

## 2016-04-07 LAB — TYPE AND SCREEN
ABO/RH(D): O POS
ANTIBODY SCREEN: NEGATIVE
UNIT DIVISION: 0
Unit division: 0

## 2016-04-07 LAB — BIRTH TISSUE RECOVERY COLLECTION (PLACENTA DONATION)

## 2016-04-07 MED ORDER — POLYSACCHARIDE IRON COMPLEX 150 MG PO CAPS
150.0000 mg | ORAL_CAPSULE | Freq: Every day | ORAL | Status: DC
  Administered 2016-04-07 – 2016-04-09 (×3): 150 mg via ORAL
  Filled 2016-04-07 (×3): qty 1

## 2016-04-07 MED ORDER — OXYCODONE HCL 5 MG PO TABS
5.0000 mg | ORAL_TABLET | ORAL | Status: DC | PRN
  Administered 2016-04-07: 5 mg via ORAL
  Filled 2016-04-07: qty 1

## 2016-04-07 MED ORDER — OXYCODONE HCL 5 MG PO TABS
10.0000 mg | ORAL_TABLET | ORAL | Status: DC | PRN
  Administered 2016-04-07 – 2016-04-08 (×7): 10 mg via ORAL
  Filled 2016-04-07 (×7): qty 2

## 2016-04-07 MED ORDER — MAGNESIUM OXIDE 400 (241.3 MG) MG PO TABS
400.0000 mg | ORAL_TABLET | Freq: Every day | ORAL | Status: DC
  Administered 2016-04-07 – 2016-04-09 (×3): 400 mg via ORAL
  Filled 2016-04-07 (×4): qty 1

## 2016-04-07 NOTE — Plan of Care (Signed)
Problem: Pain Management: Goal: General experience of comfort will improve and pain level will decrease Outcome: Progressing Pt reports control of incisional pain with '5mg'$  of Oxy IR.    Problem: Bowel/Gastric: Goal: Gastrointestinal status will improve Outcome: Completed/Met Date Met: 04/07/16 Pt reports last bowel movement 10/17.  Reports feeling like she needs to pass gas.  Active bowel sounds, soft abdomen on assessment.

## 2016-04-07 NOTE — Plan of Care (Signed)
Problem: Activity: Goal: Ability to tolerate increased activity will improve Outcome: Progressing Pt ambulated to the bathroom with minimal assistance and without dizziness or feeling faint.  Pt remained standing in the bathroom and washed face and brushed teeth without difficulty, dizziness or feeling faint.

## 2016-04-07 NOTE — Progress Notes (Deleted)
Shouting noted from pt room at nurses station, "get out!" heard by Clinical research associatewriter and Jarvis MorganW. Semple RN (verbal confirmation confirmed).  In to see pt, pt stated, "The baby hurt me when he latched."  Assisted MOB with breastfeeding.

## 2016-04-07 NOTE — Progress Notes (Signed)
POSTOPERATIVE DAY # 1 S/P CS and transfusion 2 units PRBC   S:         Reports feeling ok - no dizziness / some pain sitting to standing             Tolerating po intake / no nausea / no vomiting / no flatus / no BM             Bleeding is moderate             Pain controlled withmotrin and oxycodone             Up with assistance / foley in place  Newborn breast feeding   O:  VS: BP 127/68 (BP Location: Right Arm)   Pulse 70   Temp 98.3 F (36.8 C) (Oral)   Resp 18   Ht 5\' 2"  (1.575 m)   Wt 97.1 kg (214 lb)   SpO2 100%   Breastfeeding? Unknown   BMI 39.14 kg/m    LABS:               Recent Labs  04/06/16 1236 04/07/16 0539  WBC 17.4* 14.9*  HGB 6.6* 8.1*  PLT 131* 135*               Bloodtype: --/--/O POS (10/18 1420)  Rubella: Immune (03/24 0000)                                             I&O: Intake/Output      10/19 0701 - 10/20 0700 10/20 0701 - 10/21 0700   I.V. (mL/kg)     Blood 736    IV Piggyback     Total Intake(mL/kg) 736 (7.6)    Urine (mL/kg/hr) 1900 (0.8)    Stool     Blood     Total Output 1900     Net -1164                     Physical Exam:             Alert and Oriented X3  Lungs: Clear and unlabored  Heart: regular rate and rhythm / no mumurs  Abdomen: soft, non-tender, non-distended              Fundus: firm, non-tender, Ueven             Dressing intact         Lochia: moderate             Foley draining yellow urine  Extremities: trace edema, no calf pain or tenderness, negative Homans  A:        POD # 1 S/P CS            ABL anemia s/p transfusion x 2 units - stable  P:        Routine postoperative care              advance post-op care             Start iron and magnesium   Marlinda MikeBAILEY, Natasha Burda CNM, MSN, FACNM 04/07/2016, 10:14 AM

## 2016-04-08 MED ORDER — TRAMADOL HCL 50 MG PO TABS
50.0000 mg | ORAL_TABLET | Freq: Two times a day (BID) | ORAL | Status: DC
  Administered 2016-04-08 – 2016-04-09 (×2): 50 mg via ORAL
  Filled 2016-04-08 (×2): qty 1

## 2016-04-08 MED ORDER — OXYCODONE-ACETAMINOPHEN 7.5-325 MG PO TABS: 1.0000 | ORAL_TABLET | ORAL | Status: DC | PRN

## 2016-04-08 MED ORDER — OXYCODONE-ACETAMINOPHEN 5-325 MG PO TABS
1.0000 | ORAL_TABLET | ORAL | Status: DC | PRN
  Administered 2016-04-08 – 2016-04-09 (×4): 1 via ORAL
  Filled 2016-04-08 (×4): qty 1

## 2016-04-08 MED ORDER — NALBUPHINE HCL 10 MG/ML IJ SOLN
10.0000 mg | Freq: Once | INTRAMUSCULAR | Status: AC
  Administered 2016-04-08: 10 mg via SUBCUTANEOUS
  Filled 2016-04-08: qty 1

## 2016-04-08 MED ORDER — OXYCODONE HCL 5 MG PO TABS
2.5000 mg | ORAL_TABLET | ORAL | Status: DC | PRN
  Administered 2016-04-08: 2.5 mg via ORAL
  Filled 2016-04-08: qty 1

## 2016-04-08 MED ORDER — IBUPROFEN 800 MG PO TABS
800.0000 mg | ORAL_TABLET | Freq: Three times a day (TID) | ORAL | Status: DC
  Administered 2016-04-08 – 2016-04-09 (×3): 800 mg via ORAL
  Filled 2016-04-08 (×3): qty 1

## 2016-04-08 MED ORDER — BISACODYL 10 MG RE SUPP
10.0000 mg | RECTAL | Status: AC
  Administered 2016-04-08: 10 mg via RECTAL
  Filled 2016-04-08: qty 1

## 2016-04-08 NOTE — Progress Notes (Signed)
Lactation consultant requested that this patient be assessed for complaint of sore nipples.  Pt states that only right nipple is sore.  Right nipple is cracked and red.  Pt states that coconut oil is not helping.  Comfort gels given with education for use provided.  Pt instructed to call with next infant feeding so latch can be assessed.  Pt and pt's mother verbalize understanding.  Elizabeth, [facility] updated and will assess next latch with next feeding. Reynold BowenSusan Paisley Darly Fails, RN 04/08/2016 9:33 PM

## 2016-04-08 NOTE — Lactation Note (Signed)
This note was copied from a baby's chart. Lactation Consultation Note  Patient Name: Anna Bowman ZOXWR'UToday's Date: 04/08/2016 Reason for consult: Follow-up assessment Baby at 75 hr of life. Mom's breast are filling and baby is having a hard time because mom has short shaft nipples. Applied #20 NS. Baby latched comfortably, with multiple swallows, baby did pull off x2 with milk running out of her mouth. Milk was present in the NS. Mom will offer the breast 8+/24hr, if baby does not latch she will apply the NS and post pump. Reviewed milk storage guideline. Discussed engorgement prevention/treatment. Family present and supportive of bf. Mom is aware of lactation services and support group. She will call as needed.   Maternal Data    Feeding Feeding Type: Breast Fed  LATCH Score/Interventions                      Lactation Tools Discussed/Used Tools: Nipple Shields Nipple shield size: 20   Consult Status Consult Status: Follow-up Date: 04/09/16 Follow-up type: In-patient    [name] 04/08/2016, 9:18 PM

## 2016-04-08 NOTE — Lactation Note (Signed)
This note was copied from a baby's chart. Lactation Consultation Note  Breasts are filling and mom is able to easily express colostrum.  She gave the baby formula x 3 related to her own status and the fact that baby was hungry.  Reviewed plan for home which includes feeding on cue, softening the breast if baby does not and fully draining the breast 4 times in 24 hours to help achieve optimal supply. Right nipple is sore so off-center latch was explained. Encouraged support groups and outpatient services. Patient Name: Anna Bowman ZOXWR'UToday's Date: 04/08/2016 Reason for consult: Follow-up assessment   Maternal Data    Feeding Feeding Type: Formula Nipple Type: Slow - flow  LATCH Score/Interventions                      Lactation Tools Discussed/Used     Consult Status Consult Status: PRN    Soyla DryerJoseph, Varun Jourdan 04/08/2016, 11:01 AM

## 2016-04-08 NOTE — Progress Notes (Signed)
S:  Difficulty with nipples and feedings today - lactation working to assist with feedings        Reports fatigue especially with activity and persistent gas pain ( no results from dulcolax yet)  O:  VS: Blood pressure (!) 141/93, pulse 73, temperature 97.8 F (36.6 C), temperature source Oral, resp. rate 18, height 5\' 2"  (1.575 m), weight 97.1 kg (214 lb), SpO2 99 %, unknown if currently breastfeeding.       A: POD 3 s/p CS and transfusion for ABL  P: elevated BP tonight - recheck labs - anticipate DC in AM   Marlinda MikeBAILEY, Strother Everitt CNM, MSN, [facility]ACNM 04/08/2016, 10:55 PM

## 2016-04-08 NOTE — Progress Notes (Signed)
POSTOPERATIVE DAY # 3 S/P CS   S:         Reports feeling overwhelmed, soreness and incisional burning and pain not relieved until injection of pain med last night / gas pain - need to have BM but cant seem to push out             Tolerating po intake / no nausea / no vomiting / + flatus / no BM             Bleeding is light             Pain controlled withMotrin and 10 oxycodone (Nubain 10subQ last night)             Up ad lib / ambulatory some but dizziness at end of walk/ voiding QS  Newborn breast feeding   O:  VS: BP 130/69 (BP Location: Right Arm)   Pulse 65   Temp 98.4 F (36.9 C) (Oral)   Resp 18   Ht 5\' 2"  (1.575 m)   Wt 97.1 kg (214 lb)   SpO2 99%   Breastfeeding? Unknown   BMI 39.14 kg/m    LABS:               Recent Labs  04/06/16 1236 04/07/16 0539  WBC 17.4* 14.9*  HGB 6.6* 8.1*  PLT 131* 135*               Bloodtype: --/--/O POS (10/18 1420)  Rubella: Immune (03/24 0000)                                          I&O: Intake/Output      10/20 0701 - 10/21 0700 10/21 0701 - 10/22 0700   Blood     Total Intake(mL/kg)     Urine (mL/kg/hr) 1150 (0.5)    Total Output 1150     Net -1150                     Physical Exam:             Alert and Oriented X3  Lungs: Clear and unlabored  Heart: regular rate and rhythm / no mumurs  Abdomen: soft, non-tender, mildly distended, hypoactive BS             Fundus: firm, non-tender, Ueven             Dressing intact              Incision:   no erythema / no ecchymosis / no drainage  Perineum: intact  Lochia: light  Extremities: 2+ edema, no calf pain or tenderness, negative Homans  A:        POD # 3 S/P CS            S/p transfusion with ABL anemia  P:        Routine postoperative care              Encouraged more water intake to maintain adequate hydration to prevent dizziness with ambulation - walking to reduce soreness in incision. Try K-pad warm to cold to find comfort with incision burning today at rest.  Ducolax suppository for gas pain to facilitate BM. Adjust analgesia - add tramadol for soreness.  Re-evaluate for possible DC this evening if symptoms controlled with interventions today.     Marlinda Mike  CNM, MSN, [facility]ACNM 04/08/2016, 11:14 AM

## 2016-04-09 LAB — COMPREHENSIVE METABOLIC PANEL
ALT: 37 U/L (ref 14–54)
ALT: 41 U/L (ref 14–54)
AST: 49 U/L — ABNORMAL HIGH (ref 15–41)
AST: 51 U/L — ABNORMAL HIGH (ref 15–41)
Albumin: 2.5 g/dL — ABNORMAL LOW (ref 3.5–5.0)
Albumin: 2.8 g/dL — ABNORMAL LOW (ref 3.5–5.0)
Alkaline Phosphatase: 95 U/L (ref 38–126)
Alkaline Phosphatase: 104 U/L (ref 38–126)
Anion gap: 5 (ref 5–15)
Anion gap: 4 — ABNORMAL LOW (ref 5–15)
BUN: 8 mg/dL (ref 6–20)
BUN: 7 mg/dL (ref 6–20)
CO2: 27 mmol/L (ref 22–32)
CO2: 30 mmol/L (ref 22–32)
Calcium: 8.3 mg/dL — ABNORMAL LOW (ref 8.9–10.3)
Calcium: 8.7 mg/dL — ABNORMAL LOW (ref 8.9–10.3)
Chloride: 104 mmol/L (ref 101–111)
Chloride: 104 mmol/L (ref 101–111)
Creatinine, Ser: 0.67 mg/dL (ref 0.44–1.00)
Creatinine, Ser: 0.72 mg/dL (ref 0.44–1.00)
GFR calc Af Amer: >60 mL/min (ref >60)
GFR calc Af Amer: >60 mL/min (ref >60)
GFR calc non Af Amer: >60 mL/min (ref >60)
GFR calc non Af Amer: >60 mL/min (ref >60)
Glucose, Bld: 113 mg/dL — ABNORMAL HIGH (ref 65–99)
Glucose, Bld: 102 mg/dL — ABNORMAL HIGH (ref 65–99)
Potassium: 4 mmol/L (ref 3.5–5.1)
Potassium: 3.8 mmol/L (ref 3.5–5.1)
Sodium: 136 mmol/L (ref 135–145)
Sodium: 138 mmol/L (ref 135–145)
Total Bilirubin: 0.7 mg/dL (ref 0.3–1.2)
Total Bilirubin: 0.5 mg/dL (ref 0.3–1.2)
Total Protein: 5.2 g/dL — ABNORMAL LOW (ref 6.5–8.1)
Total Protein: 6.1 g/dL — ABNORMAL LOW (ref 6.5–8.1)

## 2016-04-09 LAB — CBC
HCT: 23.2 % — ABNORMAL LOW (ref 36.0–46.0)
Hemoglobin: 8.1 g/dL — ABNORMAL LOW (ref 12.0–15.0)
MCH: 30.8 pg (ref 26.0–34.0)
MCHC: 34.9 g/dL (ref 30.0–36.0)
MCV: 88.2 fL (ref 78.0–100.0)
Platelets: 146 10*3/uL — ABNORMAL LOW (ref 150–400)
RBC: 2.63 MIL/uL — ABNORMAL LOW (ref 3.87–5.11)
RDW: 14.2 % (ref 11.5–15.5)
WBC: 9.6 10*3/uL (ref 4.0–10.5)

## 2016-04-09 MED ORDER — LABETALOL HCL 100 MG PO TABS
100.0000 mg | ORAL_TABLET | Freq: Three times a day (TID) | ORAL | Status: DC
  Administered 2016-04-09: 100 mg via ORAL
  Filled 2016-04-09: qty 1

## 2016-04-09 MED ORDER — OXYCODONE-ACETAMINOPHEN 5-325 MG PO TABS: 1.0000 | ORAL_TABLET | ORAL | 0 refills | Status: DC | PRN

## 2016-04-09 MED ORDER — MAGNESIUM OXIDE 400 (241.3 MG) MG PO TABS: 400.0000 mg | ORAL_TABLET | Freq: Every day | ORAL | 4 refills | Status: DC

## 2016-04-09 MED ORDER — POLYSACCHARIDE IRON COMPLEX 150 MG PO CAPS: 150.0000 mg | ORAL_CAPSULE | Freq: Every day | ORAL | 0 refills | Status: AC

## 2016-04-09 MED ORDER — TRAMADOL HCL 50 MG PO TABS: 50.0000 mg | ORAL_TABLET | Freq: Two times a day (BID) | ORAL | 0 refills | Status: DC

## 2016-04-09 NOTE — Discharge Summary (Signed)
OB Discharge Summary  Patient Name: Anna Bowman DOB: 1986/05/11 MRN: [id number]  Date of admission: 04/05/2016  Admitting diagnosis: INDUCTION - gestational hypertension with superimposed on chronic hypertension Intrauterine pregnancy: [redacted]w[redacted]d      Date of discharge: 04/09/2016    Discharge diagnosis: Term Pregnancy Delivered      Prenatal history: G1P1001   EDC : 04/17/2016, Alternate EDD Entry  Prenatal care at [facility]  Primary provider :Ernestina Penna Prenatal course complicated by chronic hypertension, superimposed gestational hypertension, IDA , GBS positive  Prenatal Labs: ABO, Rh: --/--/O POS (10/18 1420)  Antibody: NEG (10/18 1400) Rubella: Immune (03/24 0000)  RPR: Non Reactive (10/18 1400)  HBsAg: Negative (03/24 0000)  HIV: Non-reactive (03/24 0000)  GBS: Positive (09/27 0000)                                    Hospital course:  Induction of Labor With Cesarean Section  30 y.o. yo G1P1001 at [redacted]w[redacted]d was admitted to the hospital 04/05/2016 for induction of labor. The patient went for cesarean section due to Non-Reassuring FHR, and delivered a Viable infant,@BABYSUPPRESS (DBLINK,ept,110,,1,,) Membrane Rupture Time/Date: )6:17 PM ,04/05/2016   @Details  of operation can be found in separate operative Note.  Patient had an uncomplicated postpartum course. She is ambulating, tolerating a regular diet, passing flatus, and urinating well.  Patient is discharged home in stable condition on 04/30/16.                                    Augmentation: Foley Balloon Delivering PROVIDER: Noland Fordyce                                                            Complications: None  Newborn Data: Live born female  Birth Weight: 5 lb 11.4 oz (2590 g) APGAR: 8, 8  Baby Feeding: Breast Disposition:home with mother  Post partum procedures:none  Postpartum contraception: Not Discussed    Labs: Lab Results  Component Value Date   WBC 9.6 04/09/2016   HGB 8.1 (L) 04/09/2016   HCT 23.2 (L) 04/09/2016   MCV 88.2 04/09/2016   PLT 146 (L) 04/09/2016   CMP Latest Ref Rng & Units 04/09/2016  Glucose 65 - 99 mg/dL 956(O)  BUN 6 - 20 mg/dL 8  Creatinine 1.30 - 8.65 mg/dL 7.84  Sodium 696 - 295 mmol/L 136  Potassium 3.5 - 5.1 mmol/L 4.0  Chloride 101 - 111 mmol/L 104  CO2 22 - 32 mmol/L 27  Calcium 8.9 - 10.3 mg/dL 8.3(L)  Total Protein 6.5 - 8.1 g/dL 5.2(L)  Total Bilirubin 0.3 - 1.2 mg/dL 0.7  Alkaline Phos 38 - 126 U/L 95  AST 15 - 41 U/L 49(H)  ALT 14 - 54 U/L 37    Physical Exam @ time of discharge:  Vitals:   04/08/16 0638 04/08/16 1800 04/08/16 2259 04/09/16 0630  BP: 130/69 (!) 141/93 138/88 (!) 142/85  Pulse: 65 73 80 62  Resp: 18 18  18   Temp: 98.4 F (36.9 C) 97.8 F (36.6 C)  98.5 F (36.9 C)  TempSrc: Oral Oral  Oral  SpO2:  Weight:      Height:        General: alert, cooperative and no distress Lochia: appropriate Uterine Fundus: firm Perineum: intact Incision: Healing well with no significant drainage Extremities: DVT Evaluation: No evidence of DVT seen on physical exam.   Discharge instructions:  "Baby and Me Booklet" and [facility] Booklet  Discharge Medications:    Medication List    TAKE these medications   aspirin EC 81 MG tablet Take 81 mg by mouth every morning.   cholecalciferol 1000 units tablet Commonly known as:  VITAMIN D Take 1,000 Units by mouth every morning.   iron polysaccharides 150 MG capsule Commonly known as:  NIFEREX Take 1 capsule (150 mg total) by mouth daily. Start taking on:  04/10/2016   labetalol 100 MG tablet Commonly known as:  NORMODYNE Take 100 mg by mouth 3 (three) times daily.   magnesium oxide 400 (241.3 Mg) MG tablet Commonly known as:  MAG-OX Take 1 tablet (400 mg total) by mouth daily. Constipation prevention ( can do 1/2 tablet to 1 daily) Start taking on:  04/10/2016   oxyCODONE-acetaminophen 5-325 MG tablet Commonly known as:   PERCOCET/ROXICET Take 1 tablet by mouth every 4 (four) hours as needed for moderate pain.   pantoprazole 40 MG tablet Commonly known as:  PROTONIX Take 40 mg by mouth daily.   prenatal multivitamin Tabs tablet Take 1 tablet by mouth daily at 12 noon.   traMADol 50 MG tablet Commonly known as:  ULTRAM Take 1 tablet (50 mg total) by mouth every 12 (twelve) hours.       Diet: routine diet  Activity: Advance as tolerated. Pelvic rest x 6 weeks.   Follow up:6 weeks with BP recheck this week at [facility]    Signed: Marlinda MikeBAILEY, TANYA CNM, MSN, [facility] 04/09/2016, 12:13 PM

## 2016-04-09 NOTE — Progress Notes (Signed)
Ok for discharge per Dr Billy Coastaavon. Anna Bowman, Anna Bowman

## 2016-04-09 NOTE — Lactation Note (Signed)
This note was copied from a baby's chart. Lactation Consultation Note  Patient Name: Anna Brenton Grillsshley Leinen [id number]'s Date: 04/09/2016 Reason for consult: Follow-up assessment;Infant < 6lbs Follow up visit made prior to discharge.  Mom is currently breastfeeding infant using the football hold.  She states baby is latching easier and it is more comfortable since using the nipple shield.  Mom has an abundant milk supply.  She is feeding expressed milk back from post pumping.  Mom has a DEBP at home.  Discharge instructions given.  Outpatient lactation services and support reviewed and encouraged.  Maternal Data    Feeding Feeding Type: Breast Fed  LATCH Score/Interventions Latch: Grasps breast easily, tongue down, lips flanged, rhythmical sucking. Intervention(s): Skin to skin;Teach feeding cues;Waking techniques Intervention(s): Breast compression;Breast massage  Audible Swallowing: Spontaneous and intermittent Intervention(s): Alternate breast massage  Type of Nipple: Everted at rest and after stimulation  Comfort (Breast/Nipple): Filling, red/small blisters or bruises, mild/mod discomfort  Problem noted: Mild/Moderate discomfort Interventions (Filling): Double electric pump  Hold (Positioning): No assistance needed to correctly position infant at breast. Intervention(s): Breastfeeding basics reviewed;Support Pillows  LATCH Score: 9  Lactation Tools Discussed/Used Tools: Nipple Shields Nipple shield size: 20   Consult Status Consult Status: Complete    Yisel Megill S 04/09/2016, 10:27 AM

## 2016-04-09 NOTE — Progress Notes (Signed)
POSTOPERATIVE DAY # 4 S/P CS   S:         Reports feeling much better - wants to go home             Tolerating po intake / no nausea / no vomiting / + flatus / + BM x2             Bleeding is light             Pain controlled with motrin, percocet, tramadol             Up ad lib / ambulatory/ voiding QS  Newborn breast feeding   O:  VS: BP (!) 142/85 (BP Location: Left Arm)   Pulse 62   Temp 98.5 F (36.9 C) (Oral)   Resp 18   Ht 5\' 2"  (1.575 m)   Wt 97.1 kg (214 lb)   SpO2 99%   Breastfeeding? Unknown   BMI 39.14 kg/m    LABS:               Recent Labs  04/07/16 0539 04/09/16 0523  WBC 14.9* 9.6  HGB 8.1* 8.1*  PLT 135* 146*               Bloodtype: --/--/O POS (10/18 1420)  Rubella: Immune (03/24 0000)                                Physical Exam:             Alert and Oriented X3  Lungs: Clear and unlabored  Heart: regular rate and rhythm / no mumurs  Abdomen: soft, non-tender, non-distended              Fundus: firm, non-tender, Ueven             Dressing intact honeycomb              Incision:  no erythema / no ecchymosis / no drainage  Perineum: intact  Lochia: light  Extremities: 1+ edema, no calf pain or tenderness, negative Homans  A:        POD # 4 S/P CS            Chronic hypertension with gestational hypertension             Stable labs - slight rise in LE  P:        Routine postoperative care   Consult with Dr Billy Coastaavon - previous order given to nurse via phone contact to restart Labetalol - no record of call documented in chart and order not transmitted by nurse in EHR  Restart Labetalol 100 TID Recheck labs this PM - plan DC if stable with [facility] recheck this week   Marlinda MikeBAILEY, Janiqua Friscia CNM, MSN, [facility] Jersey[facility]ACNM 04/09/2016, 12:03 PM

## 2016-04-11 ENCOUNTER — Inpatient Hospital Stay (HOSPITAL_COMMUNITY): Admission: RE | Admit: 2016-04-11 | Payer: BLUE CROSS/BLUE SHIELD | Source: Ambulatory Visit

## 2017-12-14 LAB — OB RESULTS CONSOLE RPR: RPR: NONREACTIVE

## 2017-12-14 LAB — OB RESULTS CONSOLE HEPATITIS B SURFACE ANTIGEN: Hepatitis B Surface Ag: NEGATIVE

## 2017-12-14 LAB — OB RESULTS CONSOLE HIV ANTIBODY (ROUTINE TESTING): HIV: NONREACTIVE

## 2017-12-14 LAB — OB RESULTS CONSOLE RUBELLA ANTIBODY, IGM: Rubella: IMMUNE

## 2017-12-14 LAB — OB RESULTS CONSOLE GC/CHLAMYDIA
Chlamydia: NEGATIVE
GC PROBE AMP, GENITAL: NEGATIVE

## 2017-12-14 LAB — OB RESULTS CONSOLE ABO/RH: RH Type: POSITIVE

## 2017-12-14 LAB — OB RESULTS CONSOLE ANTIBODY SCREEN: Antibody Screen: NEGATIVE

## 2018-06-03 ENCOUNTER — Encounter (HOSPITAL_COMMUNITY): Payer: Self-pay | Admitting: *Deleted

## 2018-06-03 ENCOUNTER — Inpatient Hospital Stay (HOSPITAL_COMMUNITY)
Admission: AD | Admit: 2018-06-03 | Discharge: 2018-06-04 | Disposition: A | Discharge status: Home / Self Care | Payer: BLUE CROSS/BLUE SHIELD | Source: Ambulatory Visit | Attending: Obstetrics | Admitting: Obstetrics

## 2018-06-03 DIAGNOSIS — O479 False labor, unspecified: Secondary | ICD-10-CM

## 2018-06-03 DIAGNOSIS — Z3689 Encounter for other specified antenatal screening: Secondary | ICD-10-CM

## 2018-06-03 DIAGNOSIS — Z3A35 35 weeks gestation of pregnancy: Secondary | ICD-10-CM | POA: Insufficient documentation

## 2018-06-03 DIAGNOSIS — O4703 False labor before 37 completed weeks of gestation, third trimester: Secondary | ICD-10-CM | POA: Insufficient documentation

## 2018-06-03 DIAGNOSIS — Z0371 Encounter for suspected problem with amniotic cavity and membrane ruled out: Secondary | ICD-10-CM

## 2018-06-03 HISTORY — DX: Personal history of other medical treatment: Z92.89

## 2018-06-03 NOTE — MAU Note (Signed)
Urine in lab 

## 2018-06-03 NOTE — MAU Note (Addendum)
"  I think my water broke around 1 or 2 this morning, light brown in color. Contractions since last night, worse today. "I think I'm in labor" Denies bleeding, positive FM

## 2018-06-04 ENCOUNTER — Encounter (HOSPITAL_COMMUNITY): Payer: Self-pay | Admitting: *Deleted

## 2018-06-04 DIAGNOSIS — O4703 False labor before 37 completed weeks of gestation, third trimester: Secondary | ICD-10-CM | POA: Diagnosis not present

## 2018-06-04 DIAGNOSIS — Z3A35 35 weeks gestation of pregnancy: Secondary | ICD-10-CM | POA: Diagnosis not present

## 2018-06-04 DIAGNOSIS — O26893 Other specified pregnancy related conditions, third trimester: Secondary | ICD-10-CM | POA: Diagnosis present

## 2018-06-04 DIAGNOSIS — Z0371 Encounter for suspected problem with amniotic cavity and membrane ruled out: Secondary | ICD-10-CM | POA: Diagnosis not present

## 2018-06-04 NOTE — MAU Provider Note (Signed)
S: Ms. Anna Bowman is a 32 y.o. G2P1001 at 6385w5d  who presents to MAU today complaining of leaking of fluid since around 0100 this morning. She reports being in the restroom when she wiped and saw a light brown watery discharge, "unsure if my water broke or not". Denies having a gush of fluid or continuing to where pads. She denies vaginal bleeding. She endorses contractions. Reports contractions occur every 8 minutes, denies having to breathe or not talk during contractions. "I think I am in labor but am not sure". She reports normal fetal movement.  She has a repeat C/S scheduled for 06/28/18. [facility]   O: BP 131/79   Pulse 71   Temp 98.1 F (36.7 C) (Oral)   Resp 17   Ht 5\' 3"  (1.6 m)   Wt 92.5 kg   BMI 36.14 kg/m   GENERAL: Well-developed, well-nourished female in no acute distress.  HEAD: Normocephalic, atraumatic.  CHEST: Normal effort of breathing, regular heart rate ABDOMEN: Soft, nontender, gravid PELVIC: Normal external female genitalia. Vagina is pink and rugated. Cervix with normal contour, no lesions. Cervix visually closed. Normal discharge. negative pooling.   Cervical exam: upon reassessment hour and half later, no cervical change noted  Dilation: Closed(external fingertip) Effacement (%): Thick Cervical Position: Posterior Station: Ballotable Presentation: Vertex Exam by:: Steward DroneVeronica Kenric Ginger CNM   Fetal Monitoring: Baseline: 130 Variability: moderate Accelerations: present  Decelerations: none  Contractions: 2 UC with UI/ mild by palpation   POCT Fern Negative   A: SIUP at 6354w6d  Membranes intact  False labor  NST reactive   P: Discharge home  Follow up as scheduled in office for prenatal care  Labor precautions and reasons to return to MAU discussed  Fetal kick counts and Hydration   Follow-up Information    [facility] Follow up.   Why:  Follow up as scheduled for prenatal appointments and return to MAU as needed  for labor evaluation  Contact information: [address] [place] [id number] [phone]           Sharyon CableRogers, Danique Hartsough C, CNM 06/04/2018 1:07 AM

## 2018-06-04 NOTE — Discharge Instructions (Signed)
Reasons to return to MAU:  1.  Contractions are  5 - 6 minutes apart, each last 1 minute, these have been going on for 1-2 hours, and you cannot walk or talk during them 2.  You have a large gush of fluid, or a trickle of fluid that will not stop and you have to wear a pad 3.  You have bleeding that is bright red, heavier than spotting--like menstrual bleeding (spotting can be normal in early labor or after a check of your cervix) 4.  You do not feel the baby moving like he/she normally does

## 2018-06-10 ENCOUNTER — Other Ambulatory Visit: Payer: Self-pay | Admitting: Obstetrics

## 2018-06-14 ENCOUNTER — Encounter (HOSPITAL_COMMUNITY): Payer: Self-pay

## 2018-06-14 ENCOUNTER — Telehealth (HOSPITAL_COMMUNITY): Payer: Self-pay | Admitting: *Deleted

## 2018-06-14 NOTE — Telephone Encounter (Signed)
Preadmission screen  

## 2018-06-17 ENCOUNTER — Telehealth (HOSPITAL_COMMUNITY): Payer: Self-pay | Admitting: *Deleted

## 2018-06-17 ENCOUNTER — Other Ambulatory Visit: Payer: Self-pay

## 2018-06-17 ENCOUNTER — Inpatient Hospital Stay (HOSPITAL_COMMUNITY): Payer: BLUE CROSS/BLUE SHIELD | Admitting: Anesthesiology

## 2018-06-17 ENCOUNTER — Encounter (HOSPITAL_COMMUNITY): Payer: Self-pay | Admitting: Anesthesiology

## 2018-06-17 ENCOUNTER — Encounter (HOSPITAL_COMMUNITY): Payer: Self-pay

## 2018-06-17 ENCOUNTER — Inpatient Hospital Stay (HOSPITAL_COMMUNITY)
Admission: AD | Admit: 2018-06-17 | Discharge: 2018-06-20 | DRG: 787 | Disposition: A | Discharge status: Home / Self Care | Payer: BLUE CROSS/BLUE SHIELD | Attending: Obstetrics | Admitting: Obstetrics

## 2018-06-17 ENCOUNTER — Encounter (HOSPITAL_COMMUNITY)
Admission: AD | Disposition: A | Discharge status: Home / Self Care | Payer: Self-pay | Source: Home / Self Care | Attending: Obstetrics

## 2018-06-17 ENCOUNTER — Inpatient Hospital Stay (EMERGENCY_DEPARTMENT_HOSPITAL)
Admission: AD | Admit: 2018-06-17 | Discharge: 2018-06-17 | Disposition: A | Discharge status: Home / Self Care | Payer: BLUE CROSS/BLUE SHIELD | Source: Home / Self Care | Attending: Obstetrics & Gynecology | Admitting: Obstetrics & Gynecology

## 2018-06-17 DIAGNOSIS — O471 False labor at or after 37 completed weeks of gestation: Secondary | ICD-10-CM | POA: Insufficient documentation

## 2018-06-17 DIAGNOSIS — O9081 Anemia of the puerperium: Secondary | ICD-10-CM | POA: Diagnosis not present

## 2018-06-17 DIAGNOSIS — Z3A37 37 weeks gestation of pregnancy: Secondary | ICD-10-CM

## 2018-06-17 DIAGNOSIS — L910 Hypertrophic scar: Secondary | ICD-10-CM | POA: Diagnosis present

## 2018-06-17 DIAGNOSIS — D62 Acute posthemorrhagic anemia: Secondary | ICD-10-CM | POA: Diagnosis not present

## 2018-06-17 DIAGNOSIS — Z98891 History of uterine scar from previous surgery: Secondary | ICD-10-CM

## 2018-06-17 DIAGNOSIS — O34211 Maternal care for low transverse scar from previous cesarean delivery: Principal | ICD-10-CM | POA: Diagnosis present

## 2018-06-17 DIAGNOSIS — O99824 Streptococcus B carrier state complicating childbirth: Secondary | ICD-10-CM | POA: Diagnosis present

## 2018-06-17 DIAGNOSIS — O479 False labor, unspecified: Secondary | ICD-10-CM

## 2018-06-17 HISTORY — DX: Unspecified infectious disease: B99.9

## 2018-06-17 LAB — URINALYSIS, ROUTINE W REFLEX MICROSCOPIC
Bilirubin Urine: NEGATIVE
Glucose, UA: NEGATIVE mg/dL
Hgb urine dipstick: NEGATIVE
Ketones, ur: NEGATIVE mg/dL
Leukocytes, UA: NEGATIVE
Nitrite: NEGATIVE
Protein, ur: NEGATIVE mg/dL
SPECIFIC GRAVITY, URINE: 1.01 (ref 1.005–1.030)
pH: 6 (ref 5.0–8.0)

## 2018-06-17 LAB — CBC
HEMATOCRIT: 38.8 % (ref 36.0–46.0)
Hemoglobin: 13.1 g/dL (ref 12.0–15.0)
MCH: 31.2 pg (ref 26.0–34.0)
MCHC: 33.8 g/dL (ref 30.0–36.0)
MCV: 92.4 fL (ref 80.0–100.0)
Platelets: 174 10*3/uL (ref 150–400)
RBC: 4.2 MIL/uL (ref 3.87–5.11)
RDW: 13.8 % (ref 11.5–15.5)
WBC: 8.1 10*3/uL (ref 4.0–10.5)
nRBC: 0 % (ref 0.0–0.2)

## 2018-06-17 LAB — TYPE AND SCREEN
ABO/RH(D): O POS
Antibody Screen: NEGATIVE

## 2018-06-17 SURGERY — Surgical Case
Anesthesia: Epidural

## 2018-06-17 MED ORDER — LACTATED RINGERS IV SOLN: INTRAVENOUS | Status: DC

## 2018-06-17 MED ORDER — NALBUPHINE HCL 10 MG/ML IJ SOLN: 5.0000 mg | Freq: Once | INTRAMUSCULAR | Status: DC | PRN

## 2018-06-17 MED ORDER — KETOROLAC TROMETHAMINE 30 MG/ML IJ SOLN
30.0000 mg | Freq: Four times a day (QID) | INTRAMUSCULAR | Status: AC | PRN
  Filled 2018-06-17: qty 1

## 2018-06-17 MED ORDER — SOD CITRATE-CITRIC ACID 500-334 MG/5ML PO SOLN
30.0000 mL | Freq: Once | ORAL | Status: AC
  Administered 2018-06-17: 30 mL via ORAL

## 2018-06-17 MED ORDER — SOD CITRATE-CITRIC ACID 500-334 MG/5ML PO SOLN
ORAL | Status: AC
  Administered 2018-06-17: 30 mL via ORAL
  Filled 2018-06-17: qty 15

## 2018-06-17 MED ORDER — KETOROLAC TROMETHAMINE 30 MG/ML IJ SOLN
INTRAMUSCULAR | Status: AC
  Filled 2018-06-17: qty 1

## 2018-06-17 MED ORDER — PHENYLEPHRINE 8 MG IN D5W 100 ML (0.08MG/ML) PREMIX OPTIME
INJECTION | INTRAVENOUS | Status: DC | PRN
  Administered 2018-06-17: 60 ug/min via INTRAVENOUS

## 2018-06-17 MED ORDER — LACTATED RINGERS IV SOLN
INTRAVENOUS | Status: DC
  Administered 2018-06-17 (×3): via INTRAVENOUS

## 2018-06-17 MED ORDER — FENTANYL CITRATE (PF) 100 MCG/2ML IJ SOLN
INTRAMUSCULAR | Status: DC | PRN
  Administered 2018-06-17: 15 ug via INTRATHECAL

## 2018-06-17 MED ORDER — BUPIVACAINE HCL (PF) 0.5 % IJ SOLN
INTRAMUSCULAR | Status: DC | PRN
  Administered 2018-06-17: 30 mL

## 2018-06-17 MED ORDER — OXYTOCIN 10 UNIT/ML IJ SOLN
INTRAMUSCULAR | Status: AC
  Filled 2018-06-17: qty 4

## 2018-06-17 MED ORDER — BUPIVACAINE IN DEXTROSE 0.75-8.25 % IT SOLN
INTRATHECAL | Status: DC | PRN
  Administered 2018-06-17: 1.4 mL via INTRATHECAL

## 2018-06-17 MED ORDER — SODIUM CHLORIDE 0.9% FLUSH: 3.0000 mL | INTRAVENOUS | Status: DC | PRN

## 2018-06-17 MED ORDER — TRIAMCINOLONE ACETONIDE 40 MG/ML IJ SUSP
40.0000 mg | Freq: Once | INTRAMUSCULAR | Status: DC
  Filled 2018-06-17: qty 1

## 2018-06-17 MED ORDER — TRANEXAMIC ACID-NACL 1000-0.7 MG/100ML-% IV SOLN
INTRAVENOUS | Status: AC
  Filled 2018-06-17: qty 100

## 2018-06-17 MED ORDER — OXYCODONE-ACETAMINOPHEN 5-325 MG PO TABS
2.0000 | ORAL_TABLET | Freq: Once | ORAL | Status: AC
  Administered 2018-06-17: 2 via ORAL
  Filled 2018-06-17: qty 2

## 2018-06-17 MED ORDER — METOCLOPRAMIDE HCL 5 MG/ML IJ SOLN
INTRAMUSCULAR | Status: AC
  Filled 2018-06-17: qty 2

## 2018-06-17 MED ORDER — KETOROLAC TROMETHAMINE 30 MG/ML IJ SOLN
30.0000 mg | Freq: Once | INTRAMUSCULAR | Status: AC | PRN
  Administered 2018-06-17: 30 mg via INTRAVENOUS

## 2018-06-17 MED ORDER — CEFAZOLIN SODIUM-DEXTROSE 2-4 GM/100ML-% IV SOLN
2.0000 g | INTRAVENOUS | Status: AC
  Administered 2018-06-17: 2 g via INTRAVENOUS

## 2018-06-17 MED ORDER — NALBUPHINE HCL 10 MG/ML IJ SOLN: 5.0000 mg | INTRAMUSCULAR | Status: DC | PRN

## 2018-06-17 MED ORDER — SCOPOLAMINE 1 MG/3DAYS TD PT72
MEDICATED_PATCH | TRANSDERMAL | Status: AC
  Filled 2018-06-17: qty 1

## 2018-06-17 MED ORDER — HYDROMORPHONE HCL 1 MG/ML IJ SOLN: 0.2500 mg | INTRAMUSCULAR | Status: DC | PRN

## 2018-06-17 MED ORDER — FAMOTIDINE IN NACL 20-0.9 MG/50ML-% IV SOLN
INTRAVENOUS | Status: AC
  Administered 2018-06-17: 20 mg via INTRAVENOUS
  Filled 2018-06-17: qty 50

## 2018-06-17 MED ORDER — KETOROLAC TROMETHAMINE 30 MG/ML IJ SOLN: 30.0000 mg | Freq: Four times a day (QID) | INTRAMUSCULAR | Status: AC | PRN

## 2018-06-17 MED ORDER — NALOXONE HCL 4 MG/10ML IJ SOLN
1.0000 ug/kg/h | INTRAVENOUS | Status: DC | PRN
  Filled 2018-06-17: qty 5

## 2018-06-17 MED ORDER — SCOPOLAMINE 1 MG/3DAYS TD PT72
MEDICATED_PATCH | TRANSDERMAL | Status: DC | PRN
  Administered 2018-06-17: 1 via TRANSDERMAL

## 2018-06-17 MED ORDER — DEXAMETHASONE SODIUM PHOSPHATE 10 MG/ML IJ SOLN
INTRAMUSCULAR | Status: AC
  Filled 2018-06-17: qty 1

## 2018-06-17 MED ORDER — MORPHINE SULFATE (PF) 0.5 MG/ML IJ SOLN
INTRAMUSCULAR | Status: DC | PRN
  Administered 2018-06-17: .15 mg via INTRATHECAL

## 2018-06-17 MED ORDER — MEPERIDINE HCL 25 MG/ML IJ SOLN: 6.2500 mg | INTRAMUSCULAR | Status: DC | PRN

## 2018-06-17 MED ORDER — DIPHENHYDRAMINE HCL 50 MG/ML IJ SOLN: 12.5000 mg | INTRAMUSCULAR | Status: DC | PRN

## 2018-06-17 MED ORDER — NALOXONE HCL 0.4 MG/ML IJ SOLN: 0.4000 mg | INTRAMUSCULAR | Status: DC | PRN

## 2018-06-17 MED ORDER — TRANEXAMIC ACID 1000 MG/10ML IV SOLN
INTRAVENOUS | Status: DC | PRN
  Administered 2018-06-17: 1000 mg via INTRAVENOUS

## 2018-06-17 MED ORDER — PHENYLEPHRINE 8 MG IN D5W 100 ML (0.08MG/ML) PREMIX OPTIME
INJECTION | INTRAVENOUS | Status: AC
  Filled 2018-06-17: qty 100

## 2018-06-17 MED ORDER — SCOPOLAMINE 1 MG/3DAYS TD PT72: 1.0000 | MEDICATED_PATCH | Freq: Once | TRANSDERMAL | Status: DC

## 2018-06-17 MED ORDER — CEFAZOLIN SODIUM-DEXTROSE 2-4 GM/100ML-% IV SOLN: 2.0000 g | INTRAVENOUS | Status: DC

## 2018-06-17 MED ORDER — ACETAMINOPHEN 500 MG PO TABS
1000.0000 mg | ORAL_TABLET | Freq: Four times a day (QID) | ORAL | Status: DC
  Filled 2018-06-17: qty 2

## 2018-06-17 MED ORDER — TRIAMCINOLONE ACETONIDE 40 MG/ML IJ SUSP
INTRAMUSCULAR | Status: DC | PRN
  Administered 2018-06-17: 40 mg via INTRADERMAL

## 2018-06-17 MED ORDER — ONDANSETRON HCL 4 MG/2ML IJ SOLN
INTRAMUSCULAR | Status: AC
  Filled 2018-06-17: qty 2

## 2018-06-17 MED ORDER — DIPHENHYDRAMINE HCL 25 MG PO CAPS
25.0000 mg | ORAL_CAPSULE | ORAL | Status: DC | PRN
  Filled 2018-06-17: qty 1

## 2018-06-17 MED ORDER — METOCLOPRAMIDE HCL 5 MG/ML IJ SOLN
INTRAMUSCULAR | Status: DC | PRN
  Administered 2018-06-17: 10 mg via INTRAVENOUS

## 2018-06-17 MED ORDER — PROMETHAZINE HCL 25 MG/ML IJ SOLN: 6.2500 mg | INTRAMUSCULAR | Status: DC | PRN

## 2018-06-17 MED ORDER — FAMOTIDINE IN NACL 20-0.9 MG/50ML-% IV SOLN
20.0000 mg | Freq: Once | INTRAVENOUS | Status: AC
  Administered 2018-06-17: 20 mg via INTRAVENOUS

## 2018-06-17 MED ORDER — ONDANSETRON HCL 4 MG/2ML IJ SOLN
4.0000 mg | Freq: Three times a day (TID) | INTRAMUSCULAR | Status: DC | PRN
  Administered 2018-06-18: 4 mg via INTRAVENOUS
  Filled 2018-06-17: qty 2

## 2018-06-17 MED ORDER — MORPHINE SULFATE (PF) 0.5 MG/ML IJ SOLN
INTRAMUSCULAR | Status: AC
  Filled 2018-06-17: qty 10

## 2018-06-17 MED ORDER — LACTATED RINGERS IV BOLUS
1000.0000 mL | Freq: Once | INTRAVENOUS | Status: AC
  Administered 2018-06-17: 1000 mL via INTRAVENOUS

## 2018-06-17 MED ORDER — DEXAMETHASONE SODIUM PHOSPHATE 10 MG/ML IJ SOLN
INTRAMUSCULAR | Status: DC | PRN
  Administered 2018-06-17: 10 mg via INTRAVENOUS

## 2018-06-17 MED ORDER — BUPIVACAINE HCL (PF) 0.5 % IJ SOLN
30.0000 mL | Freq: Once | INTRAMUSCULAR | Status: DC
  Filled 2018-06-17: qty 30

## 2018-06-17 MED ORDER — ONDANSETRON HCL 4 MG/2ML IJ SOLN
INTRAMUSCULAR | Status: DC | PRN
  Administered 2018-06-17: 4 mg via INTRAVENOUS

## 2018-06-17 MED ORDER — OXYTOCIN 10 UNIT/ML IJ SOLN
INTRAVENOUS | Status: DC | PRN
  Administered 2018-06-17: 40 [IU] via INTRAVENOUS

## 2018-06-17 MED ORDER — LACTATED RINGERS IV SOLN
INTRAVENOUS | Status: DC | PRN
  Administered 2018-06-17: 22:00:00 via INTRAVENOUS

## 2018-06-17 MED ORDER — BUPIVACAINE HCL (PF) 0.5 % IJ SOLN
INTRAMUSCULAR | Status: AC
  Filled 2018-06-17: qty 30

## 2018-06-17 MED ORDER — FENTANYL CITRATE (PF) 100 MCG/2ML IJ SOLN
INTRAMUSCULAR | Status: AC
  Filled 2018-06-17: qty 2

## 2018-06-17 SURGICAL SUPPLY — 40 items
BENZOIN TINCTURE PRP APPL 2/3 (GAUZE/BANDAGES/DRESSINGS) ×3 IMPLANT
CHLORAPREP W/TINT 26ML (MISCELLANEOUS) ×3 IMPLANT
CLAMP CORD UMBIL (MISCELLANEOUS) IMPLANT
CLOSURE WOUND 1/2 X4 (GAUZE/BANDAGES/DRESSINGS) ×1
CLOTH BEACON ORANGE TIMEOUT ST (SAFETY) ×3 IMPLANT
DRSG OPSITE POSTOP 4X10 (GAUZE/BANDAGES/DRESSINGS) ×3 IMPLANT
ELECT REM PT RETURN 9FT ADLT (ELECTROSURGICAL) ×3
ELECTRODE REM PT RTRN 9FT ADLT (ELECTROSURGICAL) ×1 IMPLANT
EXTRACTOR VACUUM M CUP 4 TUBE (SUCTIONS) IMPLANT
EXTRACTOR VACUUM M CUP 4' TUBE (SUCTIONS)
GLOVE BIO SURGEON STRL SZ 6.5 (GLOVE) ×2 IMPLANT
GLOVE BIO SURGEONS STRL SZ 6.5 (GLOVE) ×1
GLOVE BIOGEL PI IND STRL 7.0 (GLOVE) ×2 IMPLANT
GLOVE BIOGEL PI INDICATOR 7.0 (GLOVE) ×4
GOWN STRL REUS W/TWL LRG LVL3 (GOWN DISPOSABLE) ×6 IMPLANT
HEMOSTAT ARISTA ABSORB 3G PWDR (MISCELLANEOUS) ×3 IMPLANT
KIT ABG SYR 3ML LUER SLIP (SYRINGE) IMPLANT
NDL SAFETY ECLIPSE 18X1.5 (NEEDLE) ×2 IMPLANT
NEEDLE HYPO 18GX1.5 SHARP (NEEDLE) ×4
NEEDLE HYPO 22GX1.5 SAFETY (NEEDLE) IMPLANT
NEEDLE HYPO 25X5/8 SAFETYGLIDE (NEEDLE) IMPLANT
NS IRRIG 1000ML POUR BTL (IV SOLUTION) ×3 IMPLANT
PACK C SECTION WH (CUSTOM PROCEDURE TRAY) ×3 IMPLANT
PAD OB MATERNITY 4.3X12.25 (PERSONAL CARE ITEMS) ×3 IMPLANT
PENCIL SMOKE EVAC W/HOLSTER (ELECTROSURGICAL) ×3 IMPLANT
SPONGE LAP 18X18 RF (DISPOSABLE) ×9 IMPLANT
STRIP CLOSURE SKIN 1/2X4 (GAUZE/BANDAGES/DRESSINGS) ×2 IMPLANT
SUT MON AB 4-0 PS1 27 (SUTURE) ×3 IMPLANT
SUT PLAIN 0 NONE (SUTURE) IMPLANT
SUT PLAIN 2 0 XLH (SUTURE) IMPLANT
SUT VIC AB 0 CT1 36 (SUTURE) ×9 IMPLANT
SUT VIC AB 0 CTX 36 (SUTURE) ×4
SUT VIC AB 0 CTX36XBRD ANBCTRL (SUTURE) ×2 IMPLANT
SUT VIC AB 2-0 CT1 27 (SUTURE) ×2
SUT VIC AB 2-0 CT1 TAPERPNT 27 (SUTURE) ×1 IMPLANT
SYR 10ML LL (SYRINGE) ×6 IMPLANT
SYR CONTROL 10ML LL (SYRINGE) IMPLANT
TOWEL OR 17X24 6PK STRL BLUE (TOWEL DISPOSABLE) ×3 IMPLANT
TRAY FOLEY W/BAG SLVR 14FR LF (SET/KITS/TRAYS/PACK) IMPLANT
WATER STERILE IRR 1000ML POUR (IV SOLUTION) ×3 IMPLANT

## 2018-06-17 NOTE — Discharge Instructions (Signed)
Braxton Hicks Contractions Contractions of the uterus can occur throughout pregnancy, but they are not always a sign that you are in labor. You may have practice contractions called Braxton Hicks contractions. These false labor contractions are sometimes confused with true labor. What are Braxton Hicks contractions? Braxton Hicks contractions are tightening movements that occur in the muscles of the uterus before labor. Unlike true labor contractions, these contractions do not result in opening (dilation) and thinning of the cervix. Toward the end of pregnancy (32-34 weeks), Braxton Hicks contractions can happen more often and may become stronger. These contractions are sometimes difficult to tell apart from true labor because they can be very uncomfortable. You should not feel embarrassed if you go to the hospital with false labor. Sometimes, the only way to tell if you are in true labor is for your health care provider to look for changes in the cervix. The health care provider will do a physical exam and may monitor your contractions. If you are not in true labor, the exam should show that your cervix is not dilating and your water has not broken. If there are no other health problems associated with your pregnancy, it is completely safe for you to be sent home with false labor. You may continue to have Braxton Hicks contractions until you go into true labor. How to tell the difference between true labor and false labor True labor  Contractions last 30-70 seconds.  Contractions become very regular.  Discomfort is usually felt in the top of the uterus, and it spreads to the lower abdomen and low back.  Contractions do not go away with walking.  Contractions usually become more intense and increase in frequency.  The cervix dilates and gets thinner. False labor  Contractions are usually shorter and not as strong as true labor contractions.  Contractions are usually irregular.  Contractions  are often felt in the front of the lower abdomen and in the groin.  Contractions may go away when you walk around or change positions while lying down.  Contractions get weaker and are shorter-lasting as time goes on.  The cervix usually does not dilate or become thin. Follow these instructions at home:   Take over-the-counter and prescription medicines only as told by your health care provider.  Keep up with your usual exercises and follow other instructions from your health care provider.  Eat and drink lightly if you think you are going into labor.  If Braxton Hicks contractions are making you uncomfortable: ? Change your position from lying down or resting to walking, or change from walking to resting. ? Sit and rest in a tub of warm water. ? Drink enough fluid to keep your urine pale yellow. Dehydration may cause these contractions. ? Do slow and deep breathing several times an hour.  Keep all follow-up prenatal visits as told by your health care provider. This is important. Contact a health care provider if:  You have a fever.  You have continuous pain in your abdomen. Get help right away if:  Your contractions become stronger, more regular, and closer together.  You have fluid leaking or gushing from your vagina.  You pass blood-tinged mucus (bloody show).  You have bleeding from your vagina.  You have low back pain that you never had before.  You feel your baby's head pushing down and causing pelvic pressure.  Your baby is not moving inside you as much as it used to. Summary  Contractions that occur before labor are   called Braxton Hicks contractions, false labor, or practice contractions.  Braxton Hicks contractions are usually shorter, weaker, farther apart, and less regular than true labor contractions. True labor contractions usually become progressively stronger and regular, and they become more frequent.  Manage discomfort from Braxton Hicks contractions  by changing position, resting in a warm bath, drinking plenty of water, or practicing deep breathing. This information is not intended to replace advice given to you by your health care provider. Make sure you discuss any questions you have with your health care provider. Document Released: 10/19/2016 Document Revised: 03/20/2017 Document Reviewed: 10/19/2016 Elsevier Interactive Patient Education  2019 [employer]  

## 2018-06-17 NOTE — Anesthesia Procedure Notes (Signed)
Spinal  Patient location during procedure: OR Start time: 06/17/2018 8:57 PM End time: 06/17/2018 9:00 PM Staffing Anesthesiologist: Leilani AbleHatchett, Carrick Rijos, MD Performed: anesthesiologist  Preanesthetic Checklist Completed: patient identified, site marked, surgical consent, pre-op evaluation, timeout performed, IV checked, risks and benefits discussed and monitors and equipment checked Spinal Block Patient position: sitting Prep: site prepped and draped and DuraPrep Patient monitoring: continuous pulse ox and blood pressure Approach: midline Location: L3-4 Injection technique: single-shot Needle Needle type: Pencan  Needle gauge: 24 G Needle length: 10 cm Needle insertion depth: 6 cm Assessment Sensory level: T4

## 2018-06-17 NOTE — Anesthesia Preprocedure Evaluation (Addendum)
Anesthesia Evaluation  Patient identified by MRN, date of birth, ID band Patient awake    Reviewed: Allergy & Precautions, H&P , NPO status , Patient's Chart, lab work & pertinent test results  Airway Mallampati: I       Dental no notable dental hx. (+) Teeth Intact   Pulmonary neg pulmonary ROS,    Pulmonary exam normal breath sounds clear to auscultation       Cardiovascular hypertension, Pt. on home beta blockers negative cardio ROS Normal cardiovascular exam Rhythm:Regular Rate:Normal     Neuro/Psych negative neurological ROS  negative psych ROS   GI/Hepatic Neg liver ROS, GERD  Medicated and Controlled,  Endo/Other  negative endocrine ROS  Renal/GU negative Renal ROS  negative genitourinary   Musculoskeletal negative musculoskeletal ROS (+)   Abdominal (+) + obese,   Peds  Hematology   Anesthesia Other Findings   Reproductive/Obstetrics (+) Pregnancy                            Anesthesia Physical Anesthesia Plan  ASA: II  Anesthesia Plan: Epidural   Post-op Pain Management:    Induction:   PONV Risk Score and Plan: 3 and Ondansetron, Dexamethasone and Scopolamine patch - Pre-op  Airway Management Planned: Natural Airway and Nasal Cannula  Additional Equipment:   Intra-op Plan:   Post-operative Plan:   Informed Consent: I have reviewed the patients History and Physical, chart, labs and discussed the procedure including the risks, benefits and alternatives for the proposed anesthesia with the patient or authorized representative who has indicated his/her understanding and acceptance.     Plan Discussed with: CRNA and Surgeon  Anesthesia Plan Comments:        Anesthesia Quick Evaluation

## 2018-06-17 NOTE — MAU Note (Signed)
To OR via stretcher accompanied by Karl Itoiana Ansah-Mensah RN

## 2018-06-17 NOTE — Anesthesia Postprocedure Evaluation (Signed)
Anesthesia Post Note  Patient: Anna Bowman  Procedure(s) Performed: Repeat CESAREAN SECTION (N/A )     Patient location during evaluation: PACU Anesthesia Type: Spinal Level of consciousness: awake Pain management: pain level controlled Vital Signs Assessment: post-procedure vital signs reviewed and stable Respiratory status: spontaneous breathing Cardiovascular status: stable Postop Assessment: no headache, no backache, spinal receding, patient able to bend at knees and no apparent nausea or vomiting Anesthetic complications: no    Last Vitals:  Vitals:   06/17/18 2240 06/17/18 2245  BP:  119/72  Pulse: 72 61  Resp: 19 15  Temp:    SpO2: 99% 96%    Last Pain:  Vitals:   06/17/18 2227  TempSrc: Oral  PainSc:    Pain Goal:    LLE Motor Response: Purposeful movement (06/17/18 2245) LLE Sensation: Tingling (06/17/18 2245) RLE Motor Response: Purposeful movement (06/17/18 2245) RLE Sensation: Tingling (06/17/18 2245)      Caren MacadamJohn F Sussie Minor Jr

## 2018-06-17 NOTE — MAU Note (Signed)
Pt arrived to MAU with u/c's Q4 min. Denies any vag bleeding or leaking of water. Reports fetal movement.

## 2018-06-17 NOTE — Op Note (Signed)
06/17/2018  10:19 PM  PATIENT:  Anna GrillsAshley Argo  32 y.o. female  PRE-OPERATIVE DIAGNOSIS:  Previous Cesarean Section:Repeat Cesarean;  Active labor; Keloid scar revision  POST-OPERATIVE DIAGNOSIS:  Previous Cesarean Section:Repeat Cesarean;  Active labor; Keloid scar revision  PROCEDURE:  Procedure(s) with comments: Repeat CESAREAN SECTION (N/A) - EDD: 07/03/18  Tracey RNFA Low transverse cesarean section with 2 layer closure  SURGEON:  Surgeon(s) and Role:    Noland Fordyce* Hendel Gatliff, MD - Primary  PHYSICIAN ASSISTANT:   ASSISTANTS: Carlean JewsMeredith Sigmon, CNM  ANESTHESIA:   local and spinal  EBL:  539 mL   BLOOD ADMINISTERED:none  DRAINS: Urinary Catheter (Foley)   LOCAL MEDICATIONS USED:  MARCAINE   , Amount: 30 cc ml and OTHER mixed with 40 mg Kenalog  SPECIMEN:  Source of Specimen:  Placenta  DISPOSITION OF SPECIMEN:  Labor and delivery for disposal  COUNTS:  YES  TOURNIQUET:  * No tourniquets in log *  DICTATION: .Note written in EPIC  PLAN OF CARE: Admit to inpatient   PATIENT DISPOSITION:  PACU - hemodynamically stable.   Delay start of Pharmacological VTE agent (>24hrs) due to surgical blood loss or risk of bleeding: yes     Findings:  @BABYSEXEBC @ infant,  APGAR (1 MIN): 9   APGAR (5 MINS): 9   APGAR (10 MINS):   Normal uterus, tubes and ovaries, normal placenta. 3VC, clear amniotic fluid  EBL: 500 cc Antibiotics:   2g Ancef Complications: none  Indications: This is a 32 y.o. year-old, G2, P1 at 6348w5d admitted for active labor. Risks benefits and alternatives of the procedure were discussed with the patient who agreed to proceed  Procedure:  After informed consent was obtained the patient was taken to the operating room where spinal anesthesia was initiated.  She was prepped and draped in the normal sterile fashion in dorsal supine position with a leftward tilt.  A foley catheter was in place.  30 cc of 1/2% Marcaine mixed with 40 mg of Kenalog was injected  under her hypertrophic keloid scar at the Pfannenstiel site.  An elliptical incision was made to remove the keloid scar. Dissection was carried down with the Bovie cautery until the fascia was reached. The fascia was incised in the midline. The incision was extended laterally with the Mayo scissors. The inferior aspect of the fascial incision was grasped with the Coker clamps, elevated up and the underlying rectus muscles were dissected off sharply. The superior aspect of the fascial incision was grasped with the Coker clamps elevated up and the underlying rectus muscles were dissected off sharply.  The peritoneum was entered sharply. The peritoneal incision was extended superiorly and inferiorly with good visualization of the bladder. The bladder blade was inserted and palpation was done to assess the fetal position and the location of the uterine vessels.  A bladder flap was created sharply the lower segment of the uterus was incised sharply with the scalpel and extended  bluntly in the cephalo-caudal fashion. The infant was grasped, brought to the incision,  rotated and the infant was delivered with fundal pressure. The nose and mouth were bulb suctioned. The cord was clamped and cut after 1 minute delay. The infant was handed off to the waiting pediatrician. The placenta was expressed. The uterus was exteriorized. The uterus was cleared of all clots and debris. The uterine incision was repaired with 0 Vicryl in a running locked fashion.  A second layer of the same suture was used in an imbricating fashion to obtain  excellent hemostasis.  An additional figure-of-eight was placed in the midpoint just below the incision to control a expanding hematoma.  2 additional figure-of-eight sutures were placed in the left angle of the incision for hemostasis.  Upon further inspection of the uterus a small tear in the serosal and very distal myometrial layers were noted in the right fundal region.  About 4 figure-of-eight  sutures were needed to control the bleeding from this spot.  The uterus was then returned to the abdomen, the gutters were cleared of all clots and debris. The uterine incision was reinspected and found to be hemostatic.  No active bleeding was noted but given the additional sutures needed decision was made to give the patient 1 g of trans-examined acid.  I also used Arista powder over the right fundal tear and the left angle of the incision.  The peritoneum was grasped and closed with 2-0 Vicryl in a running fashion. The cut muscle edges and the underside of the fascia were inspected and found to be hemostatic.  Given patient's history of subfascial hematoma and her prior pregnancy I use the remaining Arista powder under the fascia.  The fascia was closed with 0 Vicryl in a single layer . The subcutaneous tissue was irrigated. Scarpa's layer was closed with a 2-0 plain gut suture. The skin was closed with a 4-0 Monocryl in a single layer. The patient tolerated the procedure well. Sponge lap and needle counts were correct x3 and patient was taken to the recovery room in a stable condition.  Lendon ColonelKelly A Laterrance Nauta 06/17/2018 10:22 PM

## 2018-06-17 NOTE — MAU Note (Signed)
Pt states she was having UCs intermittently yesterday, but approx 2200 they became more painful.  UCs are now more intense & frequent.  Pt states pain as 6 on 0-10 pain scale.  Denies vag bleeding or LOF.  States she notices leaking when using restroom, but nothing that continues during day. States also having loose stools since last night.

## 2018-06-17 NOTE — Brief Op Note (Signed)
06/17/2018  10:19 PM  PATIENT:  Brenton GrillsAshley Pohlman  32 y.o. female  PRE-OPERATIVE DIAGNOSIS:  Previous Cesarean Section:Repeat Cesarean;  Active labor; Keloid scar revision  POST-OPERATIVE DIAGNOSIS:  Previous Cesarean Section:Repeat Cesarean;  Active labor; Keloid scar revision  PROCEDURE:  Procedure(s) with comments: Repeat CESAREAN SECTION (N/A) - EDD: 07/03/18  Tracey RNFA Low transverse cesarean section with 2 layer closure  SURGEON:  Surgeon(s) and Role:    Noland Fordyce* Jaelee Laughter, MD - Primary  PHYSICIAN ASSISTANT:   ASSISTANTS: Carlean JewsMeredith Sigmon, CNM  ANESTHESIA:   local and spinal  EBL:  539 mL   BLOOD ADMINISTERED:none  DRAINS: Urinary Catheter (Foley)   LOCAL MEDICATIONS USED:  MARCAINE   , Amount: 30 cc ml and OTHER mixed with 40 mg Kenalog  SPECIMEN:  Source of Specimen:  Placenta  DISPOSITION OF SPECIMEN:  Labor and delivery for disposal  COUNTS:  YES  TOURNIQUET:  * No tourniquets in log *  DICTATION: .Note written in EPIC  PLAN OF CARE: Admit to inpatient   PATIENT DISPOSITION:  PACU - hemodynamically stable.   Delay start of Pharmacological VTE agent (>24hrs) due to surgical blood loss or risk of bleeding: yes

## 2018-06-17 NOTE — MAU Provider Note (Signed)
  S: Ms. Anna Bowman is a 32 y.o. G2P1001 at 3945w5d  who presents to MAU today complaining of contractions with irregular pattern since 2200 yesterday. She denies vaginal bleeding. She endorses LOF. She reports normal fetal movement.    O: BP 130/87 (BP Location: Right Arm)   Pulse 71   Temp 98 F (36.7 C) (Oral)   Resp 14   Ht 5\' 4"  (1.626 m)   Wt 92.4 kg   SpO2 100%   BMI 34.97 kg/m  GENERAL: Well-developed, well-nourished female in no acute distress.  HEAD: Normocephalic, atraumatic.  CHEST: Normal effort of breathing, regular heart rate ABDOMEN: Soft, nontender, gravid  Cervical exam:  Dilation: Fingertip Effacement (%): Thick Exam by:: jaton burgess rn   Fetal Monitoring: Baseline: 125 bpm Variability: moderate  Accelerations: 15x15 Decelerations: none Contractions: irregular pattern   Patient with unchanged cervix after 1.5 hours. She was offered nubain, percocet or ambien for pain prior to DC home. Percocet 2 tabs given prior to DC.  Patient continues to feel her contractions irregularly. She currently rates her pain 7/10 and is able to talk through her contractions.    A: SIUP at 8845w5d  False labor Braxton hicks contractions Cervix unchanged    P:  Discharge home in stable condition Percocet given for pain prior to DC home Strict return precautions Strict labor precautions   Dominik Yordy, Harolyn RutherfordJennifer I, NP 06/17/2018 11:49 AM

## 2018-06-17 NOTE — Telephone Encounter (Signed)
Preadmission screen  

## 2018-06-17 NOTE — Transfer of Care (Signed)
Immediate Anesthesia Transfer of Care Note  Patient: Anna Bowman  Procedure(s) Performed: Repeat CESAREAN SECTION (N/A )  Patient Location: PACU  Anesthesia Type:Spinal  Level of Consciousness: awake, alert , oriented and patient cooperative  Airway & Oxygen Therapy: Patient Spontanous Breathing  Post-op Assessment: Report given to RN and Post -op Vital signs reviewed and stable  Post vital signs: Reviewed and stable  Last Vitals:  Vitals Value Taken Time  BP    Temp    Pulse 60 06/17/2018 10:28 PM  Resp 16 06/17/2018 10:28 PM  SpO2 98 % 06/17/2018 10:28 PM  Vitals shown include unvalidated device data.  Last Pain:  Vitals:   06/17/18 2024  TempSrc: Oral  PainSc:          Complications: No apparent anesthesia complications

## 2018-06-17 NOTE — H&P (Signed)
Anna Bowman is a 32 y.o. G2P1001 at 7757w5d presenting for active labor. Pt notes onset contractions last night. Seen in MAU this am, no significant cervical change and sent home with Percocet. Pt states no pain relief from meds and continued worsening contractions . Good fetal movement, No vaginal bleeding, not leaking fluid .  [facility] -Dated by last menstrual period consistent with 6 and 8-week ultrasound -History of PCOS with normal early diabetic screen.  Stopped metformin at 12 weeks.  Elevated third trimester diabetic screen at 143 with normal 3-hour test. -History of chronic hypertension prior to pregnancy.  Patient was off all blood pressure medicines prior to pregnancy.  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  No current ulcerations.  Not on Valtrex. -History of primary cesarean section after failed induction of labor for chronic hypertension.  Patient with significant postpartum anemia and fascial hematoma. -Keloid former. -Fetal growth.  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  Was on vancomycin several times and for many weeks.  Followed by Dr. Loreta AveMann   Prenatal Transfer Tool  Maternal Diabetes: No Genetic Screening: Normal Maternal Ultrasounds/Referrals: Normal Fetal Ultrasounds or other Referrals:  None Maternal Substance Abuse:  No Significant Maternal Medications:  None Significant Maternal Lab Results: None     OB History    Gravida  2   Para  1   Term  1   Preterm      AB      Living  1     SAB      TAB      Ectopic      Multiple  0   Live Births  1          Past Medical History:  Diagnosis Date  . Anemia   . GERD (gastroesophageal reflux disease)   . Gestational hypertension without significant proteinuria,  postpartum   . History of blood transfusion 03/2016  . HSV infection   . Hypertension    GHTN with 1st pregnancy  . Infection    recurrent c-diff  . Thrombocytopenia affecting pregnancy [facility](HCC)    Past Surgical History:  Procedure Laterality Date  . CESAREAN SECTION N/A 04/05/2016   Procedure: CESAREAN SECTION;  Surgeon: Noland FordyceKelly Juliene Kirsh, MD;  [facility];  Service: Obstetrics;  Laterality: N/A;  . CHOLECYSTECTOMY    . TONSILLECTOMY     Family History: family history includes Cancer in her paternal aunt; Hypertension in her father and mother; Stroke in her maternal grandmother. Social History:  reports that she has never smoked. She has never used smokeless tobacco. She reports that she does not drink alcohol or use drugs.  Review of Systems - Negative except Painful contractions   Dilation: 5 Exam by:: Dr. Ernestina Pennafogleman unknown if currently breastfeeding.  Physical Exam:  Gen: Uncomfortable with contractions, tearful from pain  Abd: gravid, NT, no RUQ pain LE: Trace edema, equal bilaterally, non-tender Cervix: 5 cm, bulging bag, 100 percent effaced, vertex -2  Prenatal labs: ABO, Rh: O/Positive/-- (06/28 0000) Antibody: Negative (06/28 0000) Rubella: Immune (06/28 0000) RPR: Nonreactive (06/28 0000)  HBsAg: Negative (06/28 0000)  HIV: Non-reactive (06/28 0000)  GBS:   Positive 1 hr Glucola 143, normal 3-hour  Genetic screening normal NT, normal AFP Anatomy US normal   Assessment/Plan: 32 y.o. G2P1001 at 6657w5d  Active labor with plans for repeat cesarean section.  Recommend proceeding with repeat cesarean section.  Risk benefits discussed with patient. GBS positive.  Plan Ancef for surgical prophylaxis.  Given she is unruptured and plan for cesarean section soon can defer additional antibiotics   Lendon ColonelKelly A Orlie Cundari 06/17/2018, 8:14 PM

## 2018-06-18 ENCOUNTER — Encounter (HOSPITAL_COMMUNITY): Payer: Self-pay | Admitting: Obstetrics

## 2018-06-18 DIAGNOSIS — Z98891 History of uterine scar from previous surgery: Secondary | ICD-10-CM

## 2018-06-18 LAB — COMPREHENSIVE METABOLIC PANEL
ALT: 13 U/L (ref 0–44)
AST: 26 U/L (ref 15–41)
Albumin: 2.7 g/dL — ABNORMAL LOW (ref 3.5–5.0)
Alkaline Phosphatase: 142 U/L — ABNORMAL HIGH (ref 38–126)
Anion gap: 6 (ref 5–15)
CO2: 23 mmol/L (ref 22–32)
Calcium: 8.6 mg/dL — ABNORMAL LOW (ref 8.9–10.3)
Chloride: 106 mmol/L (ref 98–111)
Creatinine, Ser: 0.57 mg/dL (ref 0.44–1.00)
GFR calc Af Amer: >60 mL/min (ref >60)
GFR calc non Af Amer: >60 mL/min (ref >60)
Glucose, Bld: 108 mg/dL — ABNORMAL HIGH (ref 70–99)
POTASSIUM: 4.6 mmol/L (ref 3.5–5.1)
Sodium: 135 mmol/L (ref 135–145)
Total Bilirubin: 0.8 mg/dL (ref 0.3–1.2)
Total Protein: 6.3 g/dL — ABNORMAL LOW (ref 6.5–8.1)

## 2018-06-18 LAB — CBC
HCT: 33.2 % — ABNORMAL LOW (ref 36.0–46.0)
Hemoglobin: 11.1 g/dL — ABNORMAL LOW (ref 12.0–15.0)
MCH: 30.6 pg (ref 26.0–34.0)
MCHC: 33.4 g/dL (ref 30.0–36.0)
MCV: 91.5 fL (ref 80.0–100.0)
NRBC: 0 % (ref 0.0–0.2)
Platelets: 142 10*3/uL — ABNORMAL LOW (ref 150–400)
RBC: 3.63 MIL/uL — ABNORMAL LOW (ref 3.87–5.11)
RDW: 13.5 % (ref 11.5–15.5)
WBC: 14.4 10*3/uL — ABNORMAL HIGH (ref 4.0–10.5)

## 2018-06-18 LAB — RPR: RPR Ser Ql: NONREACTIVE

## 2018-06-18 MED ORDER — CEFAZOLIN SODIUM-DEXTROSE 2-4 GM/100ML-% IV SOLN: 2.0000 g | INTRAVENOUS | Status: DC

## 2018-06-18 MED ORDER — ZOLPIDEM TARTRATE 5 MG PO TABS: 5.0000 mg | ORAL_TABLET | Freq: Every evening | ORAL | Status: DC | PRN

## 2018-06-18 MED ORDER — IBUPROFEN 800 MG PO TABS
800.0000 mg | ORAL_TABLET | Freq: Three times a day (TID) | ORAL | Status: DC
  Administered 2018-06-18 – 2018-06-20 (×8): 800 mg via ORAL
  Filled 2018-06-18 (×8): qty 1

## 2018-06-18 MED ORDER — DIBUCAINE 1 % RE OINT: 1.0000 "application " | TOPICAL_OINTMENT | RECTAL | Status: DC | PRN

## 2018-06-18 MED ORDER — PRENATAL MULTIVITAMIN CH
1.0000 | ORAL_TABLET | Freq: Every day | ORAL | Status: DC
  Administered 2018-06-18 – 2018-06-20 (×3): 1 via ORAL
  Filled 2018-06-18 (×3): qty 1

## 2018-06-18 MED ORDER — SIMETHICONE 80 MG PO CHEW
80.0000 mg | CHEWABLE_TABLET | Freq: Three times a day (TID) | ORAL | Status: DC
  Administered 2018-06-18 – 2018-06-20 (×7): 80 mg via ORAL
  Filled 2018-06-18 (×8): qty 1

## 2018-06-18 MED ORDER — TETANUS-DIPHTH-ACELL PERTUSSIS 5-2.5-18.5 LF-MCG/0.5 IM SUSP: 0.5000 mL | Freq: Once | INTRAMUSCULAR | Status: DC

## 2018-06-18 MED ORDER — ACETAMINOPHEN 500 MG PO TABS
1000.0000 mg | ORAL_TABLET | Freq: Four times a day (QID) | ORAL | Status: DC
  Administered 2018-06-18 – 2018-06-20 (×9): 1000 mg via ORAL
  Filled 2018-06-18 (×9): qty 2

## 2018-06-18 MED ORDER — SODIUM CHLORIDE 0.9 % IV SOLN: INTRAVENOUS | Status: DC

## 2018-06-18 MED ORDER — OXYCODONE-ACETAMINOPHEN 5-325 MG PO TABS
1.0000 | ORAL_TABLET | ORAL | Status: DC | PRN
  Administered 2018-06-18 – 2018-06-19 (×3): 1 via ORAL
  Filled 2018-06-18 (×3): qty 1

## 2018-06-18 MED ORDER — LACTATED RINGERS IV SOLN
INTRAVENOUS | Status: DC
  Administered 2018-06-18: 03:00:00 via INTRAVENOUS

## 2018-06-18 MED ORDER — OXYTOCIN 40 UNITS IN LACTATED RINGERS INFUSION - SIMPLE MED: 2.5000 [IU]/h | INTRAVENOUS | Status: AC

## 2018-06-18 MED ORDER — MENTHOL 3 MG MT LOZG: 1.0000 | LOZENGE | OROMUCOSAL | Status: DC | PRN

## 2018-06-18 MED ORDER — DIPHENHYDRAMINE HCL 25 MG PO CAPS: 25.0000 mg | ORAL_CAPSULE | Freq: Four times a day (QID) | ORAL | Status: DC | PRN

## 2018-06-18 MED ORDER — WITCH HAZEL-GLYCERIN EX PADS: 1.0000 "application " | MEDICATED_PAD | CUTANEOUS | Status: DC | PRN

## 2018-06-18 MED ORDER — SIMETHICONE 80 MG PO CHEW
80.0000 mg | CHEWABLE_TABLET | ORAL | Status: DC
  Administered 2018-06-18 – 2018-06-19 (×3): 80 mg via ORAL
  Filled 2018-06-18: qty 1

## 2018-06-18 MED ORDER — SENNOSIDES-DOCUSATE SODIUM 8.6-50 MG PO TABS
2.0000 | ORAL_TABLET | ORAL | Status: DC
  Administered 2018-06-19 (×2): 2 via ORAL
  Filled 2018-06-18 (×2): qty 2

## 2018-06-18 MED ORDER — SIMETHICONE 80 MG PO CHEW: 80.0000 mg | CHEWABLE_TABLET | ORAL | Status: DC | PRN

## 2018-06-18 MED ORDER — COCONUT OIL OIL: 1.0000 "application " | TOPICAL_OIL | Status: DC | PRN

## 2018-06-18 NOTE — Progress Notes (Signed)
POSTOPERATIVE DAY # 1 S/P Repeat LTCS, baby boy "Ace"   S:         Reports feeling much better with this recovery than her last  Denies HA, visual changes, RUQ/epigastric pain              Tolerating po intake / no nausea or vomiting since this mornign/ no flatus / no BM  Denies dizziness, SOB, or CP             Bleeding is light             Pain controlled with Motrin             Up ad lib / ambulatory/foley removed at 11am and no void since  Newborn breast feeding - going well  / Circumcision - planning prior to discharge   O:  VS: BP 122/76 (BP Location: Right Arm)   Pulse (!) 54   Temp 98 F (36.7 C) (Oral)   Resp 18   Ht 5\' 4"  (1.626 m)   Wt 92.1 kg   SpO2 98%   BMI 34.84 kg/m    LABS:               Recent Labs    06/17/18 2013 06/18/18 0516  WBC 8.1 14.4*  HGB 13.1 11.1*  PLT 174 142*               Bloodtype: --/--/O POS (12/30 2013)  Rubella: Immune (06/28 0000)                                             I&O: Intake/Output      12/30 0701 - 12/31 0700 12/31 0701 - 01/01 0700   P.O. 240    I.V. (mL/kg) 2665 (28.9)    Total Intake(mL/kg) 2905 (31.5)    Urine (mL/kg/hr) 650 300 (0.5)   Blood 539    Total Output 1189 300   Net +1716 -300                     Physical Exam:             Alert and Oriented X3  Lungs: Clear and unlabored  Heart: regular rate and rhythm / no murmurs  Abdomen: soft, non-tender, non-distended hypoactive bowel sounds in all quadrants              Fundus: firm, non-tender, U-1             Dressing: honeycomb with steristrips c/d/i              Incision:  approximated with sutures / no erythema / no ecchymosis / no drainage  Perineum: intact  Lochia: small, no clots  Extremities: no edema, no calf pain or tenderness  A:        POD # 1 S/P Repeat LTCS            Transient mild range elevated BPs with orthostatic vital signs   - BPs stable now   - no neural s/s or evidence of PEC   - Mild drop in platelets, but otherwise labs  stable   - Continue close monitoring; hx of GHTN with last pregnancy   Mild ABL Anemia    - stable, asymptomatic   Routine postoperative care  May shower today  Encouraged to rest when baby rests  See lactation today   Continue current care   Carlean JewsMeredith , MSN, CNM [facility]

## 2018-06-18 NOTE — Progress Notes (Addendum)
Rn called Sigmon CNM regarding patient's low temperatures of 96 and 96.2.  RN tried two different machines, and axillary and orally.  Sigmond CNM stated to take another temperature in an hour. Then to call her if the patient is not acting WNLs.

## 2018-06-18 NOTE — Progress Notes (Signed)
Rn called Sigmon CNM regarding patient's orthostatic blood  pressures of 145/96 lying, 149/863m  Sitting, and 147/99 standing. Sigmon CNM will be in this morning and will discuss with Dr. Ernestina PennaFogleman.  Meanwhile , Sigmon CNM stated to call if blood pressure is 160/110 or greater.   Patient had no other PIH symptoms.

## 2018-06-18 NOTE — Lactation Note (Signed)
This note was copied from a baby's chart. Lactation Consultation Note  Patient Name: Anna Bowman ZOXWR'UToday's Date: 06/18/2018 Reason for consult: Initial assessment;Early term 37-38.6wks  I visited mom to provide initial breast feeding education and to offer assistance. Mother states that her baby "Ace" is latching well, and she denies pain or discomfort with his latch. She states that he eats approximately every two hours for about 10 minutes each time.  Mother has a DEBP set up in her room, but she has not used it to date. She states that she has a similar breast pump at home [facility]([employer]) and knows how to use the initiation setting. We discussed reasons for pumping due to Early Term status, and I recommended that she pump for 10-15 minutes after day time feedings and at night as needed when baby is sleepy or not latching well. I provided her with a colostrum container and discussed how to finger feed baby her colostrum. Milk storage guidelines reviewed.  I provided education on feeding frequency and duration, normal infant feeding patterns on days 1 and 2 including nighttime cluster feeding, and output expectations. I reviewed key breast feeding points from the mother and baby care booklet. I encouraged patient to feed on demand 8-12 times/day.   Baby was using a pacifier during the visit. I reviewed risks of using artificial nipples and pacifiers. Patient verbalized understanding.   Maternal Data Has patient been taught Hand Expression?: Yes Does the patient have breastfeeding experience prior to this delivery?: Yes  Feeding Feeding Type: Breast Fed  LATCH Score Latch: Repeated attempts needed to sustain latch, nipple held in mouth throughout feeding, stimulation needed to elicit sucking reflex.  Audible Swallowing: None  Type of Nipple: Everted at rest and after stimulation  Comfort (Breast/Nipple): Soft / non-tender  Hold (Positioning): Assistance needed to correctly position  infant at breast and maintain latch.  LATCH Score: 6  Interventions Interventions: (colostrum container)  Lactation Tools Discussed/Used WIC Program: No Pump Review: Setup, frequency, and cleaning;Milk Storage Initiated by:: Jarrett SohoSJarrell, RN Date initiated:: 06/18/18   Consult Status Consult Status: Follow-up Date: 06/19/18 Follow-up type: In-patient    Walker ShadowHeather Franny Selvage 06/18/2018, 2:46 PM

## 2018-06-19 MED ORDER — OXYCODONE HCL 5 MG PO TABS
5.0000 mg | ORAL_TABLET | ORAL | Status: DC | PRN
  Filled 2018-06-19 (×2): qty 1

## 2018-06-19 MED ORDER — OXYCODONE HCL 5 MG PO TABS
10.0000 mg | ORAL_TABLET | ORAL | Status: DC | PRN
  Administered 2018-06-19 – 2018-06-20 (×4): 10 mg via ORAL
  Filled 2018-06-19 (×3): qty 2

## 2018-06-19 NOTE — Lactation Note (Signed)
This note was copied from a baby's chart. Lactation Consultation Note  Patient Name: Boy Brenton Grillsshley Effertz BMWUX'LToday's Date: 06/19/2018   [facility]:  Baby showing cues as I arrived; mother stated she just fed him 15 minutes ago but is willing to feed again with my assistance.  Undressed baby and ready to assist with latching when nurse tech arrived to take baby to the nursery for his circumcision.    Swaddled baby again and tech took baby.  I discussed expectations after circumcision in regards to breast feeding and reassured mother that I would be willing to assist as needed this afternoon.  Asked mother to pump with the DEBP now and every three hours to help increase milk supply for her ETI.  Mother willing to pump and stated she only pumped once yesterday and saw no colostrum.  Reiterated the importance of feeding/pumping at least 8-12 times/24 hours.  Mother is familiar with hand expression to aid to milk supply.  She will call as needed for latch assistance when baby returns and shows feeding cues.  Father present.     Nkosi Cortright R Lanina Larranaga 06/19/2018, 12:12 PM

## 2018-06-19 NOTE — Progress Notes (Signed)
Pt requesting Ted hose. Carlean Jews CNM called and order received.

## 2018-06-19 NOTE — Progress Notes (Signed)
POSTOPERATIVE DAY # 2 S/P Repeat LTCS, baby boy "Ace"   S:         Reports feeling more sore today, but doing well with Oxycodone IR             Denies HA, visual changes, RUQ/epigastric pain              Tolerating po intake / no nausea or vomiting since this mornign/ + flatus / no BM             Denies dizziness, SOB, or CP             Bleeding is light             Pain controlled with Motrin, Tylenol, and Oxycodone IR             Up ad lib / ambulatory/voiding QS              Newborn breast feeding - going well  / Circumcision - today   O:        VS:.Blood pressure 123/83, pulse 61, temperature 97.7 F (36.5 C), temperature source Oral, resp. rate 20, height 5\' 4"  (1.626 m), weight 92.1 kg, SpO2 98 %, unknown if currently breastfeeding.            20  123/83  Sitting  98 %  -  - EY         06/18/18 2152  98.4 F (36.9 C)  62  -  18  125/81  Lying  -  -  - SW   06/18/18 14:36:57  97.7 F (36.5 C)  60  -  16  132/84  Sitting  99 %  Room Air  - DS   06/18/18 1118  98 F (36.7 C)  54Abnormal   -  -  122/76  Sitting  98 %  -  - SJ   06/18/18 0759  98 F (36.7 C)  58Abnormal   -  -  128/87  Sitting                LABS:               RecentLabs(last2labs)      Recent Labs    06/17/18 2013 06/18/18 0516  WBC 8.1 14.4*  HGB 13.1 11.1*  PLT 174 142*                 Bloodtype: --/--/O POS (12/30 2013)             Rubella: Immune (06/28 0000)                                                       Physical Exam:             Alert and Oriented X3             Lungs: Clear and unlabored             Heart: regular rate and rhythm / no murmurs             Abdomen: soft, non-tender, non-distended hypoactive bowel sounds in all quadrants              Fundus: firm, non-tender, U-2             Dressing:  honeycomb with steristrips c/d/i              Incision:  approximated with sutures / no erythema / no ecchymosis / no drainage             Perineum: intact  Lochia: small, no clots             Extremities: trace ankle/pedal edema, no calf pain or tenderness  A:        POD # 2 S/P Repeat LTCS            Transient mild range elevated BPs with orthostatic vital signs                         - BPs stable                          - no neural s/s or evidence of PEC                         - Mild drop in platelets, but otherwise labs stable                         - Continue close monitoring; hx of GHTN with last pregnancy    - Per Dr. Ernestina PennaFogleman, if BPs stable until d/c, no need for 1 week BP check             Mild ABL Anemia                          - stable, asymptomatic              Routine postoperative care              Ambulate and warm liquids today to promote bowel motility             See lactation today                  Continue current care   Anticipate discharge home tomorrow   Carlean JewsMeredith , MSN, CNM [facility]

## 2018-06-19 NOTE — Progress Notes (Signed)
Pt requesting stronger pain medication, rating pain 7/10. Had ibuprofen and 1000 mg scheduled tylenol at 0600. Currently at 3975g of tylenol in the last 24 hours. Only prn pain medication is percocet. Called [facility] CNM; she is on unit and will change percocet to oxycodone IR.

## 2018-06-20 ENCOUNTER — Other Ambulatory Visit: Payer: Self-pay | Admitting: Obstetrics

## 2018-06-20 MED ORDER — IBUPROFEN 800 MG PO TABS: 800.0000 mg | ORAL_TABLET | Freq: Three times a day (TID) | ORAL | 0 refills | Status: AC

## 2018-06-20 MED ORDER — OXYCODONE HCL 5 MG PO TABS: 5.0000 mg | ORAL_TABLET | ORAL | 0 refills | Status: AC | PRN

## 2018-06-20 NOTE — Discharge Summary (Signed)
POSTOPERATIVE DISCHARGE SUMMARY:  Patient ID: Anna Bowman MRN: [id number] DOB/AGE: 11-27-1985 33 y.o.  Admit date: 06/17/2018 Admission Diagnoses: labor onset / previous cesarean section - desired repeat c-section delivery  Discharge date:  06/20/2018 Discharge Diagnoses: POD 3 s/p repeat CS  Prenatal history: G2P1001   EDC : 07/03/2018, by Other Basis  Prenatal care at [facility]  Primary provider : Ernestina Penna Prenatal course complicated by previous CS / PCOS hx with altered glucose metabolism / chronic HTN - only ASA.  Prenatal Labs: ABO, Rh: --/--/O POS (12/30 2013) Antibody: NEG (12/30 2013) Rubella: Immune (06/28 0000)   RPR: Non Reactive (12/30 2013)  HBsAg: Negative (06/28 0000)  HIV: Non-reactive (06/28 0000)    Medical / Surgical History : Past medical history:  Past Medical History:  Diagnosis Date  . Anemia   . GERD (gastroesophageal reflux disease)   . Gestational hypertension without significant proteinuria, postpartum   . History of blood transfusion 03/2016  . HSV infection   . Hypertension    GHTN with 1st pregnancy  . Infection    recurrent c-diff  . Thrombocytopenia affecting pregnancy [facility])     Past surgical history:  Past Surgical History:  Procedure Laterality Date  . CESAREAN SECTION N/A 04/05/2016   Procedure: CESAREAN SECTION;  Surgeon: Noland Fordyce, MD;  Location: [facility] BIRTHING SUITES;  Service: Obstetrics;  Laterality: N/A;  . CESAREAN SECTION N/A 06/17/2018   Procedure: Repeat CESAREAN SECTION;  Surgeon: Noland Fordyce, MD;  Location: [facility] BIRTHING SUITES;  Service: Obstetrics;  Laterality: N/A;  EDD: 07/03/18  Tracey RNFA  . CHOLECYSTECTOMY    . TONSILLECTOMY      Family History:  Family History  Problem Relation Age of Onset  . Hypertension Mother   . Hypertension Father   . Cancer Paternal Aunt        breast  . Stroke Maternal Grandmother     Social History:  reports that she has never smoked. She has never  used smokeless tobacco. She reports that she does not drink alcohol or use drugs.  Allergies: Pineapple   Current Medications at time of admission:  Prior to Admission medications   Medication Sig Start Date End Date Taking? Authorizing Provider  aspirin EC 81 MG tablet Take 81 mg by mouth every morning.   Yes [provider]  iron polysaccharides (NIFEREX) 150 MG capsule Take 1 capsule (150 mg total) by mouth daily. 04/10/16  Yes Marlinda Mike, CNM  Prenatal Vit-Fe Fumarate-FA (PRENATAL MULTIVITAMIN) TABS tablet Take 1 tablet by mouth daily at 12 noon.   Yes [provider]   Procedures: Cesarean section delivery on 06/17/2018 with delivery of viable female newborn by Dr Ernestina Penna   See operative report for further details APGAR (1 MIN): 9   APGAR (5 MINS): 9    Postoperative / postpartum course:  Uncomplicated with discharge on POD 3  Discharge Instructions:  Discharged Condition: stable  Activity: pelvic rest and postoperative restrictions x 2   Diet: routine  Medications:  Allergies as of 06/20/2018      Reactions   Pineapple    Tongue and lips swell      Medication List    TAKE these medications   aspirin EC 81 MG tablet Take 81 mg by mouth every morning.   ibuprofen 800 MG tablet Commonly known as:  ADVIL,MOTRIN Take 1 tablet (800 mg total) by mouth every 8 (eight) hours.   iron polysaccharides 150 MG capsule Commonly known as:  NIFEREX Take  1 capsule (150 mg total) by mouth daily.   oxyCODONE 5 MG immediate release tablet Commonly known as:  Oxy IR/ROXICODONE Take 1 tablet (5 mg total) by mouth every 4 (four) hours as needed for moderate pain (pain scale 4-7).   prenatal multivitamin Tabs tablet Take 1 tablet by mouth daily at 12 noon.            Discharge Care Instructions  (From admission, onward)         Start     Ordered   06/20/18 0000  Discharge wound care:    Comments:  Leave honeycomb in place for 5 days - remove if get  wet in shower. Leave steri-strips in place x 2 weeks. Keep incision clean and dry   06/20/18 0942          Wound Care: keep clean and dry / remove honeycomb POD 5 Postpartum Instructions: [facility] discharge booklet - instructions reviewed  Discharge to: Home  Follow up :   [facility] in 6 weeks for routine postpartum visit with Dr Ernestina Penna                Signed: Marlinda Mike CNM, MSN, [facility] 06/20/2018, 9:42 AM

## 2018-06-20 NOTE — Lactation Note (Signed)
This note was copied from a baby's chart. Lactation Consultation Note  Patient Name: Anna Bowman [id number] Date: 06/20/2018 Reason for consult: Follow-up assessment;Early term 37-38.6wks;Infant < 6lbs   Follow up with mom of 62 hour old ET infant. Infant with 8 BF for 10-30 minutes, formula x 1 via bottle of 10 cc, 2 voids and 2 stools in the last 24 hours. Infant weight 5 pounds 14.7 ounces with 7% weight loss since birth. LATCH scores 8-9. Infant asleep in mom's arms, she reports he fed recently.   Mom reports she is feeding infant every 3 hours and reports she hears infant swallowing at the breast. Mom reports infant with milk in his mouth when he comes off. Mom is pumping about every 3 hours after breast feeding and reports she is not getting any colostrum in the pump. Reviewed what to expect with pumping and to follow pumping with hand expression. Enc mom to offer infant any EBM that is obtained with pumping. Mom reports her right breast feels fuller today. Mom repots nipple pain with initial latch that improves with feeding, reviewed nipple care with mom.   Reviewed I/O, signs of dehydration in the infant, signs your infant is getting enough, engorgement prevention/treatment and breast milk storage and expression.   [facility] Brochure reviewed, mom aware of OP services, BF Support Groups and LC phone #. Offered mom OP appt for follow up and she declined saying she would like to call back if appt needed.   Infant using a pacifier, Enc mom to avoid pacifiers for first 2-3 week until BF well established, mom reports she is aware it is not recommended. Enc mom to meet infant suckling needs at the breast.   Mom reports she has no questions/concerns at this time. Mom declined need for latch assistance at this time. Enc mom to call out for assistance as needed.    Maternal Data Has patient been taught Hand Expression?: Yes Does the patient have breastfeeding experience prior to this delivery?:  Yes  Feeding Feeding Type: Breast Fed  LATCH Score Latch: Grasps breast easily, tongue down, lips flanged, rhythmical sucking.  Audible Swallowing: A few with stimulation  Type of Nipple: Everted at rest and after stimulation  Comfort (Breast/Nipple): Soft / non-tender  Hold (Positioning): No assistance needed to correctly position infant at breast.  LATCH Score: 9  Interventions    Lactation Tools Discussed/Used WIC Program: No Pump Review: Setup, frequency, and cleaning;Milk Storage Initiated by:: Reviewed and encouraged post breast feeding, offer supplement to infant as available   Consult Status Consult Status: Complete Follow-up type: Call as needed    Ed Blalock 06/20/2018, 11:57 AM

## 2018-06-20 NOTE — Progress Notes (Signed)
POSTOPERATIVE DAY # 3 S/P CS  S:         Reports feeling well             Tolerating po intake / no nausea / no vomiting / + flatus / no BM             Bleeding is light             Pain controlled with Motrin and narcotic medication             Up ad lib / ambulatory/ voiding QS  Newborn Breast    O:  VS: BP 122/69 (BP Location: Right Arm)   Pulse (!) 51   Temp 97.9 F (36.6 C) (Oral)   Resp 18   Ht 5\' 4"  (1.626 m)   Wt 92.1 kg   SpO2 99%   BMI 34.84 kg/m                Physical Exam:             Alert and Oriented X3  Lungs: Clear and unlabored  Heart: regular rate and rhythm / no mumurs  Abdomen: soft, non-tender, non-distended, active BS             Fundus: firm, non-tender, Ueven             Dressing intact              Incision:  approximated with suture / no erythema / no ecchymosis / no drainage  Perineum: intact  Lochia: light  Extremities: trace edema, no calf pain or tenderness  A:        POD # 3 S/P CS             P:        Routine postoperative care              DC home     Marlinda Mike CNM, MSN, [facility] 06/20/2018, 8:40 AM

## 2018-06-27 ENCOUNTER — Encounter (HOSPITAL_COMMUNITY)
Admission: RE | Admit: 2018-06-27 | Discharge: 2018-06-27 | Disposition: A | Discharge status: Home / Self Care | Payer: BLUE CROSS/BLUE SHIELD | Source: Ambulatory Visit

## 2018-06-27 HISTORY — DX: Anemia, unspecified: D64.9

## 2018-06-27 HISTORY — DX: Gestational (pregnancy-induced) hypertension without significant proteinuria, complicating the puerperium: O13.5

## 2018-06-28 ENCOUNTER — Inpatient Hospital Stay (HOSPITAL_COMMUNITY): Admit: 2018-06-28 | Payer: BLUE CROSS/BLUE SHIELD | Admitting: Obstetrics

## 2018-09-25 ENCOUNTER — Encounter (HOSPITAL_COMMUNITY): Payer: Self-pay
